# Patient Record
Sex: Female | Born: 1970 | Race: Black or African American | Hispanic: No | Marital: Married | Smoking: Never smoker
Health system: Southern US, Community
[De-identification: ages and names within clinical notes are randomized; demographics above are authoritative.]

## PROBLEM LIST (undated history)

## (undated) DIAGNOSIS — E119 Type 2 diabetes mellitus without complications: Secondary | ICD-10-CM

## (undated) DIAGNOSIS — I10 Essential (primary) hypertension: Secondary | ICD-10-CM

## (undated) DIAGNOSIS — E785 Hyperlipidemia, unspecified: Secondary | ICD-10-CM

## (undated) DIAGNOSIS — E559 Vitamin D deficiency, unspecified: Secondary | ICD-10-CM

## (undated) HISTORY — DX: Vitamin D deficiency, unspecified: E55.9

## (undated) HISTORY — PX: TUBAL LIGATION: SHX77

## (undated) HISTORY — DX: Hyperlipidemia, unspecified: E78.5

## (undated) HISTORY — DX: Essential (primary) hypertension: I10

---

## 2001-04-10 ENCOUNTER — Emergency Department (HOSPITAL_COMMUNITY): Admission: EM | Admit: 2001-04-10 | Discharge: 2001-04-10 | Payer: Self-pay

## 2007-04-21 ENCOUNTER — Emergency Department (HOSPITAL_COMMUNITY): Admission: EM | Admit: 2007-04-21 | Discharge: 2007-04-21 | Payer: Self-pay | Admitting: Emergency Medicine

## 2008-03-25 ENCOUNTER — Emergency Department (HOSPITAL_COMMUNITY): Admission: EM | Admit: 2008-03-25 | Discharge: 2008-03-25 | Payer: Self-pay | Admitting: Emergency Medicine

## 2008-07-22 ENCOUNTER — Emergency Department (HOSPITAL_COMMUNITY): Admission: EM | Admit: 2008-07-22 | Discharge: 2008-07-22 | Payer: Self-pay | Admitting: Emergency Medicine

## 2009-11-01 ENCOUNTER — Emergency Department (HOSPITAL_COMMUNITY): Admission: EM | Admit: 2009-11-01 | Discharge: 2009-11-02 | Payer: Self-pay | Admitting: Emergency Medicine

## 2010-07-13 ENCOUNTER — Encounter: Admission: RE | Admit: 2010-07-13 | Discharge: 2010-07-13 | Payer: Self-pay | Admitting: Internal Medicine

## 2010-11-23 LAB — POCT I-STAT, CHEM 8
Creatinine, Ser: 0.6 mg/dL (ref 0.4–1.2)
Glucose, Bld: 111 mg/dL — ABNORMAL HIGH (ref 70–99)
Hemoglobin: 12.9 g/dL (ref 12.0–15.0)
Potassium: 3.6 mEq/L (ref 3.5–5.1)

## 2010-11-23 LAB — DIFFERENTIAL
Eosinophils Absolute: 0.1 10*3/uL (ref 0.0–0.7)
Eosinophils Relative: 2 % (ref 0–5)
Lymphs Abs: 4.5 10*3/uL — ABNORMAL HIGH (ref 0.7–4.0)
Monocytes Relative: 8 % (ref 3–12)
Neutrophils Relative %: 39 % — ABNORMAL LOW (ref 43–77)

## 2010-11-23 LAB — CBC
HCT: 35.7 % — ABNORMAL LOW (ref 36.0–46.0)
MCV: 78 fL (ref 78.0–100.0)
RBC: 4.57 MIL/uL (ref 3.87–5.11)
WBC: 8.7 10*3/uL (ref 4.0–10.5)

## 2010-11-27 LAB — POCT CARDIAC MARKERS
CKMB, poc: <1 ng/mL — ABNORMAL LOW (ref 1.0–8.0)
Myoglobin, poc: 26.5 ng/mL (ref 12–200)
Troponin i, poc: <0.05 ng/mL (ref 0.00–0.09)

## 2010-11-27 LAB — POCT PREGNANCY, URINE: Preg Test, Ur: NEGATIVE

## 2011-06-02 LAB — URINALYSIS, ROUTINE W REFLEX MICROSCOPIC
Bilirubin Urine: NEGATIVE
Glucose, UA: 500 — AB
Hgb urine dipstick: NEGATIVE
Ketones, ur: NEGATIVE
Protein, ur: NEGATIVE

## 2011-06-02 LAB — URINE CULTURE: Culture: NO GROWTH

## 2011-06-02 LAB — POCT PREGNANCY, URINE
Operator id: 277751
Preg Test, Ur: NEGATIVE

## 2011-06-02 LAB — WET PREP, GENITAL: Yeast Wet Prep HPF POC: NONE SEEN

## 2011-06-06 LAB — GLUCOSE, CAPILLARY: Glucose-Capillary: 111 mg/dL — ABNORMAL HIGH (ref 70–99)

## 2011-06-16 LAB — URINALYSIS, ROUTINE W REFLEX MICROSCOPIC
Bilirubin Urine: NEGATIVE
Glucose, UA: 100 — AB
Nitrite: NEGATIVE
Protein, ur: 100 — AB
Specific Gravity, Urine: 1.033 — ABNORMAL HIGH
Urobilinogen, UA: 1
pH: 6

## 2011-06-16 LAB — PREGNANCY, URINE: Preg Test, Ur: NEGATIVE

## 2011-06-16 LAB — URINE MICROSCOPIC-ADD ON

## 2011-06-16 LAB — RAPID STREP SCREEN (MED CTR MEBANE ONLY): Streptococcus, Group A Screen (Direct): NEGATIVE

## 2012-12-21 ENCOUNTER — Emergency Department (INDEPENDENT_AMBULATORY_CARE_PROVIDER_SITE_OTHER): Payer: BC Managed Care – PPO

## 2012-12-21 ENCOUNTER — Encounter (HOSPITAL_COMMUNITY): Payer: Self-pay | Admitting: *Deleted

## 2012-12-21 ENCOUNTER — Emergency Department (INDEPENDENT_AMBULATORY_CARE_PROVIDER_SITE_OTHER)
Admission: EM | Admit: 2012-12-21 | Discharge: 2012-12-21 | Disposition: A | Discharge status: Home / Self Care | Payer: BC Managed Care – PPO | Source: Home / Self Care | Attending: Emergency Medicine | Admitting: Emergency Medicine

## 2012-12-21 DIAGNOSIS — T148XXA Other injury of unspecified body region, initial encounter: Secondary | ICD-10-CM

## 2012-12-21 DIAGNOSIS — S139XXA Sprain of joints and ligaments of unspecified parts of neck, initial encounter: Secondary | ICD-10-CM

## 2012-12-21 DIAGNOSIS — S161XXA Strain of muscle, fascia and tendon at neck level, initial encounter: Secondary | ICD-10-CM

## 2012-12-21 HISTORY — DX: Type 2 diabetes mellitus without complications: E11.9

## 2012-12-21 MED ORDER — HYDROCODONE-ACETAMINOPHEN 5-325 MG PO TABS: 1.0000 | ORAL_TABLET | ORAL | Status: DC | PRN

## 2012-12-21 MED ORDER — CYCLOBENZAPRINE HCL 10 MG PO TABS
10.0000 mg | ORAL_TABLET | Freq: Three times a day (TID) | ORAL | Status: DC | PRN
Start: 2012-12-21 — End: 2013-07-11
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
## 2012-12-26 NOTE — ED Provider Notes (Signed)
Medical screening examination/treatment/procedure(s) were performed by non-physician practitioner and as supervising physician I was immediately available for consultation/collaboration.  Leslee Home, M.D.  Reuben Likes, MD 12/26/12 [phone]

## 2013-02-17 ENCOUNTER — Emergency Department (INDEPENDENT_AMBULATORY_CARE_PROVIDER_SITE_OTHER)
Admission: EM | Admit: 2013-02-17 | Discharge: 2013-02-17 | Disposition: A | Discharge status: Home / Self Care | Payer: BC Managed Care – PPO | Source: Home / Self Care

## 2013-02-17 ENCOUNTER — Encounter (HOSPITAL_COMMUNITY): Payer: Self-pay | Admitting: Emergency Medicine

## 2013-02-17 DIAGNOSIS — E119 Type 2 diabetes mellitus without complications: Secondary | ICD-10-CM

## 2013-02-17 DIAGNOSIS — S39012A Strain of muscle, fascia and tendon of lower back, initial encounter: Secondary | ICD-10-CM

## 2013-02-17 DIAGNOSIS — R81 Glycosuria: Secondary | ICD-10-CM

## 2013-02-17 DIAGNOSIS — M791 Myalgia, unspecified site: Secondary | ICD-10-CM

## 2013-02-17 DIAGNOSIS — S335XXA Sprain of ligaments of lumbar spine, initial encounter: Secondary | ICD-10-CM

## 2013-02-17 DIAGNOSIS — IMO0001 Reserved for inherently not codable concepts without codable children: Secondary | ICD-10-CM

## 2013-02-17 LAB — POCT URINALYSIS DIP (DEVICE)
Bilirubin Urine: NEGATIVE
Hgb urine dipstick: NEGATIVE
Nitrite: NEGATIVE
Specific Gravity, Urine: 1.02 (ref 1.005–1.030)
Urobilinogen, UA: 0.2 mg/dL (ref 0.0–1.0)
pH: 5.5 (ref 5.0–8.0)

## 2013-02-17 MED ORDER — DICLOFENAC POTASSIUM 50 MG PO TABS: 50.0000 mg | ORAL_TABLET | Freq: Two times a day (BID) | ORAL | Status: DC

## 2013-02-17 MED ORDER — TRAMADOL HCL 50 MG PO TABS: 50.0000 mg | ORAL_TABLET | Freq: Four times a day (QID) | ORAL | Status: DC | PRN

## 2013-02-17 NOTE — ED Provider Notes (Signed)
History     CSN: [id number]  Arrival date & time 02/17/13  1021   None     Chief Complaint  Patient presents with  . Back Pain    (Consider location/radiation/quality/duration/timing/severity/associated sxs/prior treatment) HPI Comments: 42 year old female stay she developed pain in the lower left back. She describes it as a sharp pain in the left paralumbar musculature that radiates to the upper left buttock. It is worse with movement and better with certain positions using a pillow. This morning the pain was worse. No other radiation other than as described above. She denies any known event, injury or trauma. She said she had been doing some vacuuming and other house for that would place him force on her back.   Past Medical History  Diagnosis Date  . Diabetes mellitus without complication     Past Surgical History  Procedure Laterality Date  . Tubal ligation Bilateral     No family history on file.  History  Substance Use Topics  . Smoking status: Never Smoker   . Smokeless tobacco: Not on file  . Alcohol Use: No    OB History   Grav Para Term Preterm Abortions TAB SAB Ect Mult Living                  Review of Systems  Constitutional: Negative for fever, chills and activity change.  HENT: Negative.   Respiratory: Negative.   Cardiovascular: Negative.   Musculoskeletal:       As per HPI  Skin: Negative for color change, pallor and rash.  Neurological: Negative.     Allergies  Review of patient's allergies indicates no known allergies.  Home Medications   Current Outpatient Rx  Name  Route  Sig  Dispense  Refill  . cyclobenzaprine (FLEXERIL) 10 MG tablet   Oral   Take 1 tablet (10 mg total) by mouth 3 (three) times daily as needed for muscle spasms (and muscle pain).   20 tablet   0   . diclofenac (CATAFLAM) 50 MG tablet   Oral   Take 1 tablet (50 mg total) by mouth 2 (two) times daily. Take with food   20 tablet   0   .  HYDROcodone-acetaminophen (NORCO/VICODIN) 5-325 MG per tablet   Oral   Take 1 tablet by mouth every 4 (four) hours as needed for pain.   10 tablet   0   . traMADol (ULTRAM) 50 MG tablet   Oral   Take 1 tablet (50 mg total) by mouth every 6 (six) hours as needed for pain.   15 tablet   0     BP 126/88  Pulse 85  Temp(Src) 97.1 F (36.2 C) (Oral)  Resp 16  SpO2 95%  LMP 01/21/2013  Physical Exam  Nursing note and vitals reviewed. Constitutional: She is oriented to person, place, and time. She appears well-developed and well-nourished. No distress.  HENT:  Head: Normocephalic and atraumatic.  Eyes: EOM are normal. Pupils are equal, round, and reactive to light.  Neck: Normal range of motion. Neck supple.  Musculoskeletal:  Tenderness in the left paralumbar musculature. Tenderness in the left parasacral musculature. Tenderness over the upper most left buttock musculature. No radiation to the thigh or knee. No radicular pain. Extension and flexion of the knee is normal with strength 4/5 symmetric. She is able to bear weight and ambula without difficulty.  Lymphadenopathy:    She has no cervical adenopathy.  Neurological: She is alert and oriented to person,  place, and time. No cranial nerve deficit.  Skin: Skin is warm and dry.  Psychiatric: She has a normal mood and affect.    ED Course  Procedures (including critical care time)  Labs Reviewed  POCT URINALYSIS DIP (DEVICE) - Abnormal; Notable for the following:    Glucose, UA >=1000 (*)    All other components within normal limits   No results found.   1. Lumbar strain, initial encounter   2. Myalgia       MDM  Limit Activity such as lifting, bending, stooping, pulling, twisting them exacerbate the pain. Recommend applying heat such as determined care wraps during the day. Stretches is demonstrated. Tramadol 50 mg every 4 hours when necessary pain Cataflam 50 mg 2 times a day as needed take with food. Any new  symptoms problems or worsening may return or followup with your PCP. Check her blood sugars at home and adjust her medication for your elevated blood sugar.        Hayden Rasmussen, NP 02/17/13 1203  Hayden Rasmussen, NP 02/17/13 1205  Hayden Rasmussen, NP 02/17/13 2039
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
## 2013-02-17 NOTE — ED Provider Notes (Signed)
Medical screening examination/treatment/procedure(s) were performed by non-physician practitioner and as supervising physician I was immediately available for consultation/collaboration.  Leslee Home, M.D.  Reuben Likes, MD 02/17/13 [phone]

## 2013-03-13 ENCOUNTER — Encounter (HOSPITAL_COMMUNITY): Payer: Self-pay | Admitting: Emergency Medicine

## 2013-03-13 ENCOUNTER — Emergency Department (INDEPENDENT_AMBULATORY_CARE_PROVIDER_SITE_OTHER)
Admission: EM | Admit: 2013-03-13 | Discharge: 2013-03-13 | Disposition: A | Discharge status: Home / Self Care | Payer: BC Managed Care – PPO | Source: Home / Self Care

## 2013-03-13 DIAGNOSIS — S40029A Contusion of unspecified upper arm, initial encounter: Secondary | ICD-10-CM

## 2013-03-13 DIAGNOSIS — S20219A Contusion of unspecified front wall of thorax, initial encounter: Secondary | ICD-10-CM

## 2013-03-13 DIAGNOSIS — S301XXA Contusion of abdominal wall, initial encounter: Secondary | ICD-10-CM

## 2013-03-13 DIAGNOSIS — S40021A Contusion of right upper arm, initial encounter: Secondary | ICD-10-CM
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
## 2013-07-06 ENCOUNTER — Encounter: Payer: Self-pay | Admitting: Internal Medicine

## 2013-07-06 DIAGNOSIS — E119 Type 2 diabetes mellitus without complications: Secondary | ICD-10-CM

## 2013-07-06 DIAGNOSIS — E785 Hyperlipidemia, unspecified: Secondary | ICD-10-CM

## 2013-07-06 DIAGNOSIS — I10 Essential (primary) hypertension: Secondary | ICD-10-CM | POA: Insufficient documentation

## 2013-07-11 ENCOUNTER — Ambulatory Visit: Payer: BC Managed Care – HMO | Admitting: Emergency Medicine

## 2013-07-11 ENCOUNTER — Encounter: Payer: Self-pay | Admitting: Emergency Medicine

## 2013-07-11 VITALS — BP 118/62 | HR 64 | Temp 98.2°F | Resp 16 | Ht 62.0 in | Wt 161.0 lb

## 2013-07-11 DIAGNOSIS — E119 Type 2 diabetes mellitus without complications: Secondary | ICD-10-CM

## 2013-07-11 DIAGNOSIS — D649 Anemia, unspecified: Secondary | ICD-10-CM

## 2013-07-11 DIAGNOSIS — E559 Vitamin D deficiency, unspecified: Secondary | ICD-10-CM

## 2013-07-11 LAB — CBC WITH DIFFERENTIAL/PLATELET
Hemoglobin: 12 g/dL (ref 12.0–15.0)
Lymphs Abs: 1.7 10*3/uL (ref 0.7–4.0)
Monocytes Relative: 7 % (ref 3–12)
Neutro Abs: 3.1 10*3/uL (ref 1.7–7.7)
Neutrophils Relative %: 59 % (ref 43–77)
RBC: 4.76 MIL/uL (ref 3.87–5.11)

## 2013-07-11 LAB — BASIC METABOLIC PANEL WITH GFR
CO2: 27 mEq/L (ref 19–32)
GFR, Est African American: >89 mL/min
Glucose, Bld: 207 mg/dL — ABNORMAL HIGH (ref 70–99)
Potassium: 4.1 mEq/L (ref 3.5–5.3)
Sodium: 138 mEq/L (ref 135–145)

## 2013-07-11 LAB — IRON AND TIBC: %SAT: 22 % (ref 20–55)

## 2013-07-11 LAB — VITAMIN D 25 HYDROXY (VIT D DEFICIENCY, FRACTURES): Vit D, 25-Hydroxy: 34 ng/mL (ref 30–89)

## 2013-07-11 MED ORDER — FREESTYLE SYSTEM KIT: 1.0000 | PACK | Status: AC | PRN

## 2013-07-11 NOTE — Patient Instructions (Signed)
Vitamin D Deficiency  Not having enough vitamin D is called a deficiency. Your body needs this vitamin to keep your bones strong and healthy. Having too little of it can make your bones soft or can cause other health problems.  HOME CARE  Take all vitamins, herbs, or nutrition drinks (supplements) as told by your doctor.  Have your blood tested 2 months after taking vitamins, herbs, or nutrition drinks.  Eat foods that have vitamin D. This includes:  Dairy products, cereals, or juices with added vitamin D. Check the label.  Fatty fish like salmon or trout.  Eggs.  Oysters.  Do not use tanning beds.  Stay at a healthy weight. Lose weight if needed.  Keep all doctor visits as told. GET HELP IF:  You have questions.  You continue to have problems.  You feel sick to your stomach (nauseous) or throw up (vomit).  You cannot go poop (constipated).  You feel confused.  You have severe belly (abdominal) or back pain. MAKE SURE YOU:  Understand these instructions.  Will watch your condition.  Will get help right away if you are not doing well or get worse. Document Released: 08/10/2011 Document Revised: 12/16/2012 Document Reviewed: 08/10/2011 [facility] Patient Information 2014 [place], [place]. Iron Deficiency Anemia  Anemia is when you have a low number of healthy red blood cells. HOME CARE   Ask your doctor or dietician what foods you should eat.  Take iron and vitamins as told by your doctor.  Eat foods that have iron in them. This includes liver, lean beef, whole-grain bread, eggs, dried fruit, and dark green leafy vegetables. GET HELP RIGHT AWAY IF:  You pass out (faint).  You have chest pain.  You feel sick to your stomach (nauseous) or throw up (vomit).  You get very short of breath with activity.  You are weak.  You are thirstier than normal.  You have a fast heartbeat.  You start to sweat or become lightheaded when getting up from a chair or  bed. MAKE SURE YOU:  Understand these instructions.  Will watch your condition.  Will get help right away if you are not doing well or get worse. Document Released: 09/23/2010 Document Revised: 11/13/2011 Document Reviewed: 09/23/2010 [facility] Patient Information 2014 [place], [place]. Diabetes Meal Planning Guide The diabetes meal planning guide is a tool to help you plan your meals and snacks. It is important for people with diabetes to manage their blood glucose (sugar) levels. Choosing the right foods and the right amounts throughout your day will help control your blood glucose. Eating right can even help you improve your blood pressure and reach or maintain a healthy weight. CARBOHYDRATE COUNTING MADE EASY When you eat carbohydrates, they turn to sugar. This raises your blood glucose level. Counting carbohydrates can help you control this level so you feel better. When you plan your meals by counting carbohydrates, you can have more flexibility in what you eat and balance your medicine with your food intake. Carbohydrate counting simply means adding up the total amount of carbohydrate grams in your meals and snacks. Try to eat about the same amount at each meal. Foods with carbohydrates are listed below. Each portion below is 1 carbohydrate serving or 15 grams of carbohydrates. Ask your dietician how many grams of carbohydrates you should eat at each meal or snack. Grains and Starches  1 slice bread.   English muffin or hotdog/hamburger bun.   cup cold cereal (unsweetened).   cup cooked pasta or rice.  cup starchy vegetables (corn, potatoes, peas, beans, winter squash).  1 tortilla (6 inches).   bagel.  1 waffle or pancake (size of a CD).   cup cooked cereal.  4 to 6 small crackers. *Whole grain is recommended. Fruit  1 cup fresh unsweetened berries, melon, papaya, pineapple.  1 small fresh fruit.   banana or mango.   cup fruit juice (4 oz unsweetened).    cup canned fruit in natural juice or water.  2 tbs dried fruit.  12 to 15 grapes or cherries. Milk and Yogurt  1 cup fat-free or 1% milk.  1 cup soy milk.  6 oz light yogurt with sugar-free sweetener.  6 oz low-fat soy yogurt.  6 oz plain yogurt. Vegetables  1 cup raw or  cup cooked is counted as 0 carbohydrates or a "free" food.  If you eat 3 or more servings at 1 meal, count them as 1 carbohydrate serving. Other Carbohydrates   oz chips or pretzels.   cup ice cream or frozen yogurt.   cup sherbet or sorbet.  2 inch square cake, no frosting.  1 tbs honey, sugar, jam, jelly, or syrup.  2 small cookies.  3 squares of graham crackers.  3 cups popcorn.  6 crackers.  1 cup broth-based soup.  Count 1 cup casserole or other mixed foods as 2 carbohydrate servings.  Foods with less than 20 calories in a serving may be counted as 0 carbohydrates or a "free" food. You may want to purchase a book or computer software that lists the carbohydrate gram counts of different foods. In addition, the nutrition facts panel on the labels of the foods you eat are a good source of this information. The label will tell you how big the serving size is and the total number of carbohydrate grams you will be eating per serving. Divide this number by 15 to obtain the number of carbohydrate servings in a portion. Remember, 1 carbohydrate serving equals 15 grams of carbohydrate. SERVING SIZES Measuring foods and serving sizes helps you make sure you are getting the right amount of food. The list below tells how big or small some common serving sizes are.  1 oz.........4 stacked dice.  3 oz........Marland KitchenDeck of cards.  1 tsp.......Marland KitchenTip of little finger.  1 tbs......Marland KitchenMarland KitchenThumb.  2 tbs.......Marland KitchenGolf ball.   cup......Marland KitchenHalf of a fist.  1 cup.......Marland KitchenA fist. SAMPLE DIABETES MEAL PLAN Below is a sample meal plan that includes foods from the grain and starches, dairy, vegetable, fruit, and meat  groups. A dietician can individualize a meal plan to fit your calorie needs and tell you the number of servings needed from each food group. However, controlling the total amount of carbohydrates in your meal or snack is more important than making sure you include all of the food groups at every meal. You may interchange carbohydrate containing foods (dairy, starches, and fruits). The meal plan below is an example of a 2000 calorie diet using carbohydrate counting. This meal plan has 17 carbohydrate servings. Breakfast  1 cup oatmeal (2 carb servings).   cup light yogurt (1 carb serving).  1 cup blueberries (1 carb serving).   cup almonds. Snack  1 large apple (2 carb servings).  1 low-fat string cheese stick. Lunch  Chicken breast salad.  1 cup spinach.   cup chopped tomatoes.  2 oz chicken breast, sliced.  2 tbs low-fat [place] dressing.  12 whole-wheat crackers (2 carb servings).  12 to 15 grapes (1 carb serving).  1  cup low-fat milk (1 carb serving). Snack  1 cup carrots.   cup hummus (1 carb serving). Dinner  3 oz broiled salmon.  1 cup brown rice (3 carb servings). Snack  1  cups steamed broccoli (1 carb serving) drizzled with 1 tsp olive oil and lemon juice.  1 cup light pudding (2 carb servings). DIABETES MEAL PLANNING WORKSHEET Your dietician can use this worksheet to help you decide how many servings of foods and what types of foods are right for you.  BREAKFAST Food Group and Servings / Carb Servings Grain/Starches __________________________________ Dairy __________________________________________ Vegetable ______________________________________ Fruit ___________________________________________ Meat __________________________________________ Fat ____________________________________________ LUNCH Food Group and Servings / Carb Servings Grain/Starches ___________________________________ Dairy ___________________________________________ Fruit  ____________________________________________ Meat ___________________________________________ Fat _____________________________________________ Laural Golden Food Group and Servings / Carb Servings Grain/Starches ___________________________________ Dairy ___________________________________________ Fruit ____________________________________________ Meat ___________________________________________ Fat _____________________________________________ SNACKS Food Group and Servings / Carb Servings Grain/Starches ___________________________________ Dairy ___________________________________________ Vegetable _______________________________________ Fruit ____________________________________________ Meat ___________________________________________ Fat _____________________________________________ DAILY TOTALS Starches _________________________ Vegetable ________________________ Fruit ____________________________ Dairy ____________________________ Meat ____________________________ Fat ______________________________ Document Released: 05/18/2005 Document Revised: 11/13/2011 Document Reviewed: 03/29/2009 [employer] Patient Information 2014 [place], [place].

## 2013-07-14 NOTE — Progress Notes (Signed)
Spoke with Pt about lab results.  Informed her of MF change and advised to check BS as directed by Loree Fee, PA-C and call if BS runs about 140 for Rx change. Appt sched for pt to F/U in Mid-January.

## 2013-07-14 NOTE — Progress Notes (Signed)
  Subjective:    Patient ID: Joanna Marsh, female    DOB: Mar 27, 1971, 42 y.o.   MRN: [id number]  HPI Comments: 42 yo female presents for early F/U with start of Nesina and D/C of Januvia. She has been trying to decrease portion size but is not eating any differently. She does not tolerate Iron, so added Ensure to help with vitamins but does not think it is the Diabetic brand. She added 1 Vit D but is unsure of dosage. Overall feels well, no SE with RX change. She denies checking BS regularly.  Hypertension  Hyperlipidemia  Diabetes    Current Outpatient Prescriptions on File Prior to Visit  Medication Sig Dispense Refill  . Alogliptin Benzoate (NESINA) 25 MG TABS Take 25 mg by mouth daily.      Marland Kitchen atorvastatin (LIPITOR) 40 MG tablet Take 40 mg by mouth daily. TAKES 1/2      . lisinopril (PRINIVIL,ZESTRIL) 10 MG tablet Take 10 mg by mouth daily.      Marland Kitchen METFORMIN HCL PO Take 500 mg by mouth 2 (two) times daily.        No current facility-administered medications on file prior to visit.   Review of patient's allergies indicates no known allergies.  Past Medical History  Diagnosis Date  . Diabetes mellitus without complication   . Hypertension   . Hyperlipidemia   . Unspecified vitamin D deficiency      Review of Systems  All other systems reviewed and are negative.      BP 118/62  Pulse 64  Temp(Src) 98.2 F (36.8 C) (Temporal)  Resp 16  Ht 5\' 2"  (1.575 m)  Wt 161 lb (73.029 kg)  BMI 29.44 kg/m2  LMP 07/01/2013  Objective:   Physical Exam  Nursing note and vitals reviewed. Constitutional: She appears well-developed and well-nourished.  HENT:  Head: Normocephalic.  Eyes: Pupils are equal, round, and reactive to light.  Neck: Normal range of motion.  Cardiovascular: Normal rate, regular rhythm, normal heart sounds and intact distal pulses.   Pulmonary/Chest: Effort normal and breath sounds normal.  Abdominal: Soft. Bowel sounds are normal.  Musculoskeletal:  Normal range of motion.  Neurological: She is alert.  Skin: Skin is warm and dry.  Psychiatric: She has a normal mood and affect. Judgment normal.          Assessment & Plan:  F/U DM, D-def, Iron Def, advised needs to improve diet, change Ensure to Diabetic brand, check labs, and continue current plan AD.

## 2013-09-16 ENCOUNTER — Ambulatory Visit: Payer: Self-pay | Admitting: Emergency Medicine

## 2013-11-19 ENCOUNTER — Encounter: Payer: Self-pay | Admitting: Emergency Medicine

## 2013-11-19 ENCOUNTER — Ambulatory Visit (INDEPENDENT_AMBULATORY_CARE_PROVIDER_SITE_OTHER): Payer: BC Managed Care – HMO | Admitting: Emergency Medicine

## 2013-11-19 VITALS — BP 122/80 | HR 64 | Temp 98.4°F | Resp 18 | Ht 62.0 in | Wt 160.0 lb

## 2013-11-19 DIAGNOSIS — D649 Anemia, unspecified: Secondary | ICD-10-CM

## 2013-11-19 DIAGNOSIS — E782 Mixed hyperlipidemia: Secondary | ICD-10-CM

## 2013-11-19 DIAGNOSIS — R079 Chest pain, unspecified: Secondary | ICD-10-CM

## 2013-11-19 DIAGNOSIS — E1159 Type 2 diabetes mellitus with other circulatory complications: Secondary | ICD-10-CM

## 2013-11-19 DIAGNOSIS — I10 Essential (primary) hypertension: Secondary | ICD-10-CM

## 2013-11-19 LAB — CBC WITH DIFFERENTIAL/PLATELET
Basophils Absolute: 0 10*3/uL (ref 0.0–0.1)
Basophils Relative: 0 % (ref 0–1)
Eosinophils Absolute: 0.1 10*3/uL (ref 0.0–0.7)
Eosinophils Relative: 2 % (ref 0–5)
HEMATOCRIT: 34.7 % — AB (ref 36.0–46.0)
HEMOGLOBIN: 11.5 g/dL — AB (ref 12.0–15.0)
LYMPHS ABS: 2.3 10*3/uL (ref 0.7–4.0)
Lymphocytes Relative: 42 % (ref 12–46)
MCH: 24.6 pg — ABNORMAL LOW (ref 26.0–34.0)
MCHC: 33.1 g/dL (ref 30.0–36.0)
MCV: 74.3 fL — ABNORMAL LOW (ref 78.0–100.0)
MONOS PCT: 7 % (ref 3–12)
Monocytes Absolute: 0.4 10*3/uL (ref 0.1–1.0)
NEUTROS ABS: 2.7 10*3/uL (ref 1.7–7.7)
NEUTROS PCT: 49 % (ref 43–77)
Platelets: 330 10*3/uL (ref 150–400)
RBC: 4.67 MIL/uL (ref 3.87–5.11)
RDW: 14.3 % (ref 11.5–15.5)
WBC: 5.5 10*3/uL (ref 4.0–10.5)

## 2013-11-19 LAB — HEPATIC FUNCTION PANEL
ALBUMIN: 4.3 g/dL (ref 3.5–5.2)
ALK PHOS: 38 U/L — AB (ref 39–117)
ALT: 20 U/L (ref 0–35)
AST: 23 U/L (ref 0–37)
BILIRUBIN TOTAL: 0.4 mg/dL (ref 0.2–1.2)
Bilirubin, Direct: 0.1 mg/dL (ref 0.0–0.3)
Indirect Bilirubin: 0.3 mg/dL (ref 0.2–1.2)
TOTAL PROTEIN: 6.9 g/dL (ref 6.0–8.3)

## 2013-11-19 LAB — LIPID PANEL
CHOLESTEROL: 111 mg/dL (ref 0–200)
HDL: 47 mg/dL (ref >39)
LDL Cholesterol: 54 mg/dL (ref 0–99)
Total CHOL/HDL Ratio: 2.4 Ratio
Triglycerides: 52 mg/dL (ref <150)
VLDL: 10 mg/dL (ref 0–40)

## 2013-11-19 LAB — BASIC METABOLIC PANEL WITH GFR
BUN: 6 mg/dL (ref 6–23)
CALCIUM: 9.3 mg/dL (ref 8.4–10.5)
CO2: 30 meq/L (ref 19–32)
Chloride: 102 mEq/L (ref 96–112)
Creat: 0.66 mg/dL (ref 0.50–1.10)
GFR, Est African American: >89 mL/min
GFR, Est Non African American: >89 mL/min
GLUCOSE: 132 mg/dL — AB (ref 70–99)
POTASSIUM: 4 meq/L (ref 3.5–5.3)
SODIUM: 139 meq/L (ref 135–145)

## 2013-11-19 LAB — IRON AND TIBC
%SAT: 11 % — AB (ref 20–55)
IRON: 40 ug/dL — AB (ref 42–145)
TIBC: 356 ug/dL (ref 250–470)
UIBC: 316 ug/dL (ref 125–400)

## 2013-11-19 LAB — HEMOGLOBIN A1C
HEMOGLOBIN A1C: 7.4 % — AB (ref <5.7)
MEAN PLASMA GLUCOSE: 166 mg/dL — AB (ref <117)

## 2013-11-19 LAB — VITAMIN B12: VITAMIN B 12: 278 pg/mL (ref 211–911)

## 2013-11-19 NOTE — Patient Instructions (Addendum)
VIT D NEED 5000 UNITS DAILY*  We want weight loss that will last so you should lose 1-2 pounds a week.  THAT IS IT! Please pick THREE things a month to change. Once it is a habit check off the item. Then pick another three items off the list to become habits.  If you are already doing a habit on the list GREAT!  Cross that item off! o Don't drink your calories. Ie, alcohol, soda, fruit juice, and sweet tea.  o Drink more water. Drink a glass when you feel hungry or before each meal.  o Eat breakfast - Complex carb and protein (likeDannon light and fit yogurt, oatmeal, fruit, eggs, [place] bacon). o Measure your cereal.  Eat no more than one cup a day. (ie [place]) o Eat an apple a day. o Add a vegetable a day. o Try a new vegetable a month. o Use Pam! Stop using oil or butter to cook. o Don't finish your plate or use smaller plates. o Share your dessert. o Eat sugar free Jello for dessert or frozen grapes. o Don't eat 2-3 hours before bed. o Switch to whole wheat bread, pasta, and brown rice. o Make healthier choices when you eat out. No fries! o Pick baked chicken, NOT fried. o Don't forget to SLOW DOWN when you eat. It is not going anywhere.  o Take the stairs. o Park far away in the parking lot o [employer] (or weights) for 10 minutes while watching TV. o Walk at work for 10 minutes during break. o Walk outside 1 time a week with your friend, kids, dog, or significant other. o Start a walking group at church. o Walk the mall as much as you can tolerate.  o Keep a food diary. o Weigh yourself daily. o Walk for 15 minutes 3 days per week. o Cook at home more often and eat out less.  If life happens and you go back to old habits, it is okay.  Just start over. You can do it!   If you experience chest pain, get short of breath, or tired during the exercise, please stop immediately and inform your doctor.    Bad carbs also include fruit juice, alcohol, and sweet tea. These are empty  calories that do not signal to your brain that you are full.   Please remember the good carbs are still carbs which convert into sugar. So please measure them out no more than 1/2-1 cup of rice, oatmeal, pasta, and beans.  Veggies are however free foods! Pile them on.   I like lean protein at every meal such as chicken, [place], pork chops, cottage cheese, etc. Just do not fry these meats and please center your meal around vegetable, the meats should be a side dish.   No all fruit is created equal. Please see the list below, the fruit at the bottom is higher in sugars than the fruit at the top   Diabetes and Exercise Exercising regularly is important. It is not just about losing weight. It has many health benefits, such as:  Improving your overall fitness, flexibility, and endurance.  Increasing your bone density.  Helping with weight control.  Decreasing your body fat.  Increasing your muscle strength.  Reducing stress and tension.  Improving your overall health. People with diabetes who exercise gain additional benefits because exercise:  Reduces appetite.  Improves the body's use of blood sugar (glucose).  Helps lower or control blood glucose.  Decreases blood  pressure.  Helps control blood lipids (such as cholesterol and triglycerides).  Improves the body's use of the hormone insulin by:  Increasing the body's insulin sensitivity.  Reducing the body's insulin needs.  Decreases the risk for heart disease because exercising:  Lowers cholesterol and triglycerides levels.  Increases the levels of good cholesterol (such as high-density lipoproteins [HDL]) in the body.  Lowers blood glucose levels. YOUR ACTIVITY PLAN  Choose an activity that you enjoy and set realistic goals. Your health care provider or diabetes educator can help you make an activity plan that works for you. You can break activities into 2 or 3 sessions throughout the day. Doing so is as good as one  long session. Exercise ideas include:  Taking the dog for a walk.  Taking the stairs instead of the elevator.  Dancing to your favorite song.  Doing your favorite exercise with a friend. RECOMMENDATIONS FOR EXERCISING WITH TYPE 1 OR TYPE 2 DIABETES   Check your blood glucose before exercising. If blood glucose levels are greater than 240 mg/dL, check for urine ketones. Do not exercise if ketones are present.  Avoid injecting insulin into areas of the body that are going to be exercised. For example, avoid injecting insulin into:  The arms when playing tennis.  The legs when jogging.  Keep a record of:  Food intake before and after you exercise.  Expected peak times of insulin action.  Blood glucose levels before and after you exercise.  The type and amount of exercise you have done.  Review your records with your health care provider. Your health care provider will help you to develop guidelines for adjusting food intake and insulin amounts before and after exercising.  If you take insulin or oral hypoglycemic agents, watch for signs and symptoms of hypoglycemia. They include:  Dizziness.  Shaking.  Sweating.  Chills.  Confusion.  Drink plenty of water while you exercise to prevent dehydration or heat stroke. Body water is lost during exercise and must be replaced.  Talk to your health care provider before starting an exercise program to make sure it is safe for you. Remember, almost any type of activity is better than none. Document Released: 11/11/2003 Document Revised: 04/23/2013 Document Reviewed: 01/28/2013 [facility] Patient Information 2014 [place], [place]. Diabetes and Foot Care Diabetes may cause you to have problems because of poor blood supply (circulation) to your feet and legs. This may cause the skin on your feet to become thinner, break easier, and heal more slowly. Your skin may become dry, and the skin may peel and crack. You may also have nerve damage  in your legs and feet causing decreased feeling in them. You may not notice minor injuries to your feet that could lead to infections or more serious problems. Taking care of your feet is one of the most important things you can do for yourself.  HOME CARE INSTRUCTIONS  Wear shoes at all times, even in the house. Do not go barefoot. Bare feet are easily injured.  Check your feet daily for blisters, cuts, and redness. If you cannot see the bottom of your feet, use a mirror or ask someone for help.  Wash your feet with warm water (do not use hot water) and mild soap. Then pat your feet and the areas between your toes until they are completely dry. Do not soak your feet as this can dry your skin.  Apply a moisturizing lotion or petroleum jelly (that does not contain alcohol and is unscented)  to the skin on your feet and to dry, brittle toenails. Do not apply lotion between your toes.  Trim your toenails straight across. Do not dig under them or around the cuticle. File the edges of your nails with an emery board or nail file.  Do not cut corns or calluses or try to remove them with medicine.  Wear clean socks or stockings every day. Make sure they are not too tight. Do not wear knee-high stockings since they may decrease blood flow to your legs.  Wear shoes that fit properly and have enough cushioning. To break in new shoes, wear them for just a few hours a day. This prevents you from injuring your feet. Always look in your shoes before you put them on to be sure there are no objects inside.  Do not cross your legs. This may decrease the blood flow to your feet.  If you find a minor scrape, cut, or break in the skin on your feet, keep it and the skin around it clean and dry. These areas may be cleansed with mild soap and water. Do not cleanse the area with peroxide, alcohol, or iodine.  When you remove an adhesive bandage, be sure not to damage the skin around it.  If you have a wound, look at  it several times a day to make sure it is healing.  Do not use heating pads or hot water bottles. They may burn your skin. If you have lost feeling in your feet or legs, you may not know it is happening until it is too late.  Make sure your health care provider performs a complete foot exam at least annually or more often if you have foot problems. Report any cuts, sores, or bruises to your health care provider immediately. SEEK MEDICAL CARE IF:   You have an injury that is not healing.  You have cuts or breaks in the skin.  You have an ingrown nail.  You notice redness on your legs or feet.  You feel burning or tingling in your legs or feet.  You have pain or cramps in your legs and feet.  Your legs or feet are numb.  Your feet always feel cold. SEEK IMMEDIATE MEDICAL CARE IF:   There is increasing redness, swelling, or pain in or around a wound.  There is a red line that goes up your leg.  Pus is coming from a wound.  You develop a fever or as directed by your health care provider.  You notice a bad smell coming from an ulcer or wound. Document Released: 08/18/2000 Document Revised: 04/23/2013 Document Reviewed: 01/28/2013 [facility] Patient Information 2014 [place].  IF ANY SYMPTOMS INCREASE ER Chest Pain (Nonspecific) Chest pain has many causes. Your pain could be caused by something serious, such as a heart attack or a blood clot in the lungs. It could also be caused by something less serious, such as a chest bruise or a virus. Follow up with your doctor. More lab tests or other studies may be needed to find the cause of your pain. Most of the time, nonspecific chest pain will improve within 2 to 3 days of rest and mild pain medicine. HOME CARE  For chest bruises, you may put ice on the sore area for 15-20 minutes, 03-04 times a day. Do this only if it makes you feel better.  Put ice in a plastic bag.  Place a towel between the skin and the bag.  Rest for the  next 2 to 3 days.  Go back to work if the pain improves.  See your doctor if the pain lasts longer than 1 to 2 weeks.  Only take medicine as told by your doctor.  Quit smoking if you smoke. GET HELP RIGHT AWAY IF:   There is more pain or pain that spreads to the arm, neck, jaw, back, or belly (abdomen).  You have shortness of breath.  You cough more than usual or cough up blood.  You have very bad back or belly pain, feel sick to your stomach (nauseous), or throw up (vomit).  You have very bad weakness.  You pass out (faint).  You have a fever. Any of these problems may be serious and may be an emergency. Do not wait to see if the problems will go away. Get medical help right away. Call your local emergency services 911 in [place] Do not drive yourself to the hospital. MAKE SURE YOU:   Understand these instructions.  Will watch this condition.  Will get help right away if you or your child is not doing well or gets worse. Document Released: 02/07/2008 Document Revised: 11/13/2011 Document Reviewed: 02/07/2008 [facility] Patient Information 2014 [place], [place].

## 2013-11-19 NOTE — Progress Notes (Signed)
Subjective:    Patient ID: Joanna Marsh, female    DOB: 1971-02-04, 43 y.o.   MRN: [id number]  HPI Comments: 43 yo AAF presents for 3 month F/U for HTN, Cholesterol, DM, D. Deficient. She did not f/u AD in January for recheck of labs.  She has not been checking BS or BP. She has started nessina and increased MF to TID. She is eating healthy but occasionally skips meals. He checks feet routinely and denies skin break down or neuropathy increase. She does have occasional hand/ feet numbness. She has not been exercising.  SHE HAS HAD CHEST DISCOMFORT/ SOB on/ off x several weeks. She denies any triggers. She thinks the pain is coming from her neck because it often feels tight. She denies any neck injury/ strain. She notes pain is in midchest denies sharp/ stabbing but notes pressure. She notes can last several minutes and resolves on it's own.  LAST LABS T 109 tg 78 H 41 L 52 A1C 10.1 INSULIN 28 D 38    Medication List       This list is accurate as of: 11/19/13 11:23 AM.  Always use your most recent med list.               atorvastatin 40 MG tablet  Commonly known as:  LIPITOR  Take 40 mg by mouth daily. TAKES 1/2     cholecalciferol 1000 UNITS tablet  Commonly known as:  VITAMIN D  Take 1,000 Units by mouth daily.     glucose monitoring kit monitoring kit  1 each by Does not apply route as needed for other. Please fill strips 1 qd     lisinopril 10 MG tablet  Commonly known as:  PRINIVIL,ZESTRIL  Take 10 mg by mouth daily.     METFORMIN HCL PO  Take 500 mg by mouth 3 (three) times daily.     mometasone 0.1 % cream  Commonly known as:  ELOCON  Apply 1 application topically daily.     NESINA 25 MG Tabs  Generic drug:  Alogliptin Benzoate  Take 25 mg by mouth daily.         No Known Allergies  Past Medical History  Diagnosis Date  . Diabetes mellitus without complication   . Hypertension   . Hyperlipidemia   . Unspecified vitamin D deficiency      Review  of Systems  Constitutional: Positive for fatigue.  Respiratory: Positive for shortness of breath.   Cardiovascular: Positive for chest pain.  All other systems reviewed and are negative.   BP 122/80  Pulse 64  Temp(Src) 98.4 F (36.9 C) (Temporal)  Resp 18  Ht _0  (1.575 m)  Wt 160 lb (72.576 kg)  BMI 29.26 kg/m2  LMP 11/14/2013     Objective:   Physical Exam  Nursing note and vitals reviewed. Constitutional: She is oriented to person, place, and time. She appears well-developed and well-nourished. No distress.  HENT:  Head: Normocephalic and atraumatic.  Right Ear: External ear normal.  Left Ear: External ear normal.  Nose: Nose normal.  Mouth/Throat: Oropharynx is clear and moist.  Eyes: Conjunctivae and EOM are normal. Pupils are equal, round, and reactive to light.  Neck: Normal range of motion. Neck supple. No JVD present. No thyromegaly present.  Cardiovascular: Normal rate, regular rhythm, normal heart sounds and intact distal pulses.   Pulmonary/Chest: Effort normal and breath sounds normal.  Abdominal: Soft. Bowel sounds are normal. She exhibits no distension and no  mass. There is no tenderness. There is no rebound and no guarding.  Musculoskeletal: Normal range of motion. She exhibits no edema and no tenderness.  Lymphadenopathy:    She has no cervical adenopathy.  Neurological: She is alert and oriented to person, place, and time. She has normal reflexes. No cranial nerve deficit. Coordination normal.  Skin: Skin is warm and dry. No rash noted. No erythema. No pallor.  CALLUS BIL HEELS, DRY CRACKING  Psychiatric: She has a normal mood and affect. Her behavior is normal. Judgment and thought content normal.     ekg nscspt wnl Diabetic foot exam performed    Assessment & Plan:  1.  3 month F/U for HTN, Cholesterol,DM, D. Deficient. Needs healthy diet, cardio QD and obtain healthy weight. Check Labs, Check BP if >130/80 call office, Check BS if >200 call  office. Long discussion about compliance and increased risks with horrible labs.  2.Chest pain- w/c if SX increase or ER. Refer Cardio with uncontrolled DM  3. Neck pain-NECK ROLL PILLOW, stretches explained OVER 40 minutes of exam, counseling, chart review, referral performed

## 2013-11-20 LAB — INSULIN, FASTING: Insulin fasting, serum: 11 u[IU]/mL (ref 3–28)

## 2013-11-20 LAB — FOLATE RBC: RBC Folate: 599 ng/mL (ref >280)

## 2013-11-28 ENCOUNTER — Encounter: Payer: Self-pay | Admitting: Cardiology

## 2013-11-28 ENCOUNTER — Ambulatory Visit (INDEPENDENT_AMBULATORY_CARE_PROVIDER_SITE_OTHER): Payer: BC Managed Care – PPO | Admitting: Cardiology

## 2013-11-28 VITALS — BP 120/80 | HR 60 | Ht 63.0 in | Wt 158.0 lb

## 2013-11-28 DIAGNOSIS — R079 Chest pain, unspecified: Secondary | ICD-10-CM

## 2013-11-28 NOTE — Progress Notes (Signed)
HPI The patient presents for evaluation of dyspnea. She has no prior cardiac history. However, she does have a history of diabetes. It has not been well controlled with a hemoglobin A1c greater than 10 although I do see that the most recent was 7.4. She has noticed some increasing dyspnea. This comes on sporadically. It might happen with activities such as wrestling with her 42 year old daughter. She might get some sharp chest discomfort as well. She will occasionally get this sharp chest discomfort at night. She might wake up a little breathless as well although she's not describing PND or orthopnea. She's not describing palpitations, presyncope or syncope. She does stay on her feet all day long but doesn't exercise routinely. With her usual activities she's not noticing any limiting symptoms.  No Known Allergies  Current Outpatient Prescriptions  Medication Sig Dispense Refill  . Alogliptin Benzoate (NESINA) 25 MG TABS Take 25 mg by mouth daily.      Marland Kitchen atorvastatin (LIPITOR) 40 MG tablet Take 40 mg by mouth daily. TAKES 1/2      . cholecalciferol (VITAMIN D) 1000 UNITS tablet Take 1,000 Units by mouth daily.      Marland Kitchen glucose monitoring kit (FREESTYLE) monitoring kit 1 each by Does not apply route as needed for other. Please fill strips 1 qd  100 each  5  . lisinopril (PRINIVIL,ZESTRIL) 10 MG tablet Take 10 mg by mouth daily.      Marland Kitchen METFORMIN HCL PO Take 500 mg by mouth 3 (three) times daily.       . mometasone (ELOCON) 0.1 % cream Apply 1 application topically daily.       No current facility-administered medications for this visit.    Past Medical History  Diagnosis Date  . Diabetes mellitus without complication   . Hypertension   . Hyperlipidemia   . Unspecified vitamin D deficiency     Past Surgical History  Procedure Laterality Date  . Tubal ligation Bilateral     Family History  Problem Relation Age of Onset  . Hypertension Mother   . Gout Mother   . Ulcers Mother   .  Hypertension Father   . Diabetes Father   . Ulcers Father   . Kidney disease Father     History   Social History  . Marital Status: Married    Spouse Name: N/A    Number of Children: N/A  . Years of Education: N/A   Occupational History  . Not on file.   Social History Main Topics  . Smoking status: Never Smoker   . Smokeless tobacco: Not on file  . Alcohol Use: No  . Drug Use: Not on file  . Sexual Activity: Not on file   Other Topics Concern  . Not on file   Social History Narrative  . No narrative on file    ROS:  Positive for nocturia. Otherwise as stated in the HPI and negative for all other systems.  PHYSICAL EXAM LMP 11/14/2013 GENERAL:  Well appearing HEENT:  Pupils equal round and reactive, fundi not visualized, oral mucosa unremarkable NECK:  No jugular venous distention, waveform within normal limits, carotid upstroke brisk and symmetric, no bruits, no thyromegaly LYMPHATICS:  No cervical, inguinal adenopathy LUNGS:  Clear to auscultation bilaterally BACK:  No CVA tenderness CHEST:  Unremarkable HEART:  PMI not displaced or sustained,S1 and S2 within normal limits, no S3, no S4, no clicks, no rubs, no murmurs ABD:  Flat, positive bowel sounds normal in frequency in  pitch, no bruits, no rebound, no guarding, no midline pulsatile mass, no hepatomegaly, no splenomegaly EXT:  2 plus pulses throughout, no edema, no cyanosis no clubbing SKIN:  No rashes no nodules NEURO:  Cranial nerves II through XII grossly intact, motor grossly intact throughout PSYCH:  Cognitively intact, oriented to person place and time   EKG:  Sinus rhythm, rate 85, axis within normal limits, intervals within normal limits, no acute ST-T wave changes. 11/21/13  ASSESSMENT AND PLAN  CHEST PAIN:  Her symptoms are atypical. However, she does have cardiovascular risk factors. I will bring the patient back for a POET (Plain Old Exercise Test). This will allow me to screen for obstructive  coronary disease, risk stratify and very importantly provide a prescription for exercise.  DM:  I applauded her on her improved BS.    RISK REDUCTION:  She has an excellent lipid profile.  We spend quite a bit of time discussing an exercise program.

## 2013-11-28 NOTE — Patient Instructions (Signed)
The current medical regimen is effective;  continue present plan and medications.  Your physician has requested that you have an exercise tolerance test. For further information please visit [email or web address]. Please also follow instruction sheet, as given.   

## 2013-12-03 ENCOUNTER — Telehealth (HOSPITAL_COMMUNITY): Payer: Self-pay

## 2013-12-05 ENCOUNTER — Ambulatory Visit (HOSPITAL_COMMUNITY)
Admission: RE | Admit: 2013-12-05 | Discharge: 2013-12-05 | Disposition: A | Discharge status: Home / Self Care | Payer: BC Managed Care – PPO | Source: Ambulatory Visit | Attending: Cardiovascular Disease | Admitting: Cardiovascular Disease

## 2013-12-05 DIAGNOSIS — R079 Chest pain, unspecified: Secondary | ICD-10-CM | POA: Insufficient documentation

## 2013-12-11 ENCOUNTER — Telehealth: Payer: Self-pay | Admitting: *Deleted

## 2013-12-11 NOTE — Telephone Encounter (Signed)
Note below copied from message from Dr. Antoine PocheHochrein. I placed call to pt to review results. Left message to call back   [place], Tanya NonesMeleka A     James Hochrein, MD     Sent: Thu December 11, 2013 10:56 AM      To: [facility]igger[facility] Triage           Message      No evidence of ischemia.

## 2013-12-15 NOTE — Telephone Encounter (Signed)
Reviewed results of stress testing with pt who stated understanding.

## 2013-12-24 ENCOUNTER — Other Ambulatory Visit: Payer: Self-pay

## 2013-12-29 ENCOUNTER — Other Ambulatory Visit: Payer: Self-pay | Admitting: Emergency Medicine

## 2013-12-29 DIAGNOSIS — D649 Anemia, unspecified: Secondary | ICD-10-CM

## 2013-12-30 ENCOUNTER — Other Ambulatory Visit: Payer: BC Managed Care – HMO

## 2013-12-30 DIAGNOSIS — D649 Anemia, unspecified: Secondary | ICD-10-CM

## 2013-12-30 LAB — CBC WITH DIFFERENTIAL/PLATELET
BASOS ABS: 0 10*3/uL (ref 0.0–0.1)
BASOS PCT: 0 % (ref 0–1)
EOS ABS: 0.1 10*3/uL (ref 0.0–0.7)
Eosinophils Relative: 2 % (ref 0–5)
HCT: 36 % (ref 36.0–46.0)
Hemoglobin: 11.8 g/dL — ABNORMAL LOW (ref 12.0–15.0)
Lymphocytes Relative: 34 % (ref 12–46)
Lymphs Abs: 2.5 10*3/uL (ref 0.7–4.0)
MCH: 24.6 pg — AB (ref 26.0–34.0)
MCHC: 32.8 g/dL (ref 30.0–36.0)
MCV: 75 fL — ABNORMAL LOW (ref 78.0–100.0)
MONOS PCT: 8 % (ref 3–12)
Monocytes Absolute: 0.6 10*3/uL (ref 0.1–1.0)
NEUTROS PCT: 56 % (ref 43–77)
Neutro Abs: 4.1 10*3/uL (ref 1.7–7.7)
PLATELETS: 323 10*3/uL (ref 150–400)
RBC: 4.8 MIL/uL (ref 3.87–5.11)
RDW: 13.4 % (ref 11.5–15.5)
WBC: 7.4 10*3/uL (ref 4.0–10.5)

## 2013-12-30 LAB — IRON AND TIBC
%SAT: 18 % — AB (ref 20–55)
Iron: 67 ug/dL (ref 42–145)
TIBC: 364 ug/dL (ref 250–470)
UIBC: 297 ug/dL (ref 125–400)

## 2013-12-31 LAB — VITAMIN B12: VITAMIN B 12: 254 pg/mL (ref 211–911)

## 2014-01-30 ENCOUNTER — Ambulatory Visit: Payer: Self-pay | Admitting: Emergency Medicine

## 2014-02-23 ENCOUNTER — Encounter: Payer: Self-pay | Admitting: Emergency Medicine

## 2014-02-23 ENCOUNTER — Ambulatory Visit (INDEPENDENT_AMBULATORY_CARE_PROVIDER_SITE_OTHER): Payer: No Typology Code available for payment source | Admitting: Emergency Medicine

## 2014-02-23 VITALS — BP 106/64 | HR 72 | Temp 98.0°F | Resp 18 | Ht 62.0 in | Wt 157.0 lb

## 2014-02-23 DIAGNOSIS — E782 Mixed hyperlipidemia: Secondary | ICD-10-CM

## 2014-02-23 DIAGNOSIS — M5431 Sciatica, right side: Secondary | ICD-10-CM

## 2014-02-23 DIAGNOSIS — E119 Type 2 diabetes mellitus without complications: Secondary | ICD-10-CM

## 2014-02-23 DIAGNOSIS — M5430 Sciatica, unspecified side: Secondary | ICD-10-CM

## 2014-02-23 LAB — CBC WITH DIFFERENTIAL/PLATELET
BASOS PCT: 0 % (ref 0–1)
Basophils Absolute: 0 10*3/uL (ref 0.0–0.1)
EOS ABS: 0.1 10*3/uL (ref 0.0–0.7)
Eosinophils Relative: 1 % (ref 0–5)
HCT: 35.2 % — ABNORMAL LOW (ref 36.0–46.0)
Hemoglobin: 11.7 g/dL — ABNORMAL LOW (ref 12.0–15.0)
Lymphocytes Relative: 42 % (ref 12–46)
Lymphs Abs: 2.5 10*3/uL (ref 0.7–4.0)
MCH: 24.5 pg — AB (ref 26.0–34.0)
MCHC: 33.2 g/dL (ref 30.0–36.0)
MCV: 73.6 fL — ABNORMAL LOW (ref 78.0–100.0)
Monocytes Absolute: 0.4 10*3/uL (ref 0.1–1.0)
Monocytes Relative: 7 % (ref 3–12)
NEUTROS PCT: 50 % (ref 43–77)
Neutro Abs: 3 10*3/uL (ref 1.7–7.7)
PLATELETS: 371 10*3/uL (ref 150–400)
RBC: 4.78 MIL/uL (ref 3.87–5.11)
RDW: 14.2 % (ref 11.5–15.5)
WBC: 5.9 10*3/uL (ref 4.0–10.5)

## 2014-02-23 LAB — HEMOGLOBIN A1C
HEMOGLOBIN A1C: 8.4 % — AB (ref <5.7)
Mean Plasma Glucose: 194 mg/dL — ABNORMAL HIGH (ref <117)

## 2014-02-23 MED ORDER — MOMETASONE FUROATE 0.1 % EX CREA: 1.0000 "application " | TOPICAL_CREAM | Freq: Every day | CUTANEOUS | Status: DC

## 2014-02-23 NOTE — Progress Notes (Signed)
Subjective:    Patient ID: Joanna Marsh, female    DOB: August 23, 1971, 43 y.o.   MRN: [id number]  HPI Comments: 57 AAF presents for 3 month F/U for Cholesterol, DM, D. Deficient. She is eating healthy. She is exercising sparingly. She checks feet routinely and denies skin break down or neuropathy increase. She is on Lisinopril for kidney protection with DM, she does NOT have HTN.  WBC             7.4   12/30/2013 HGB            11.8   12/30/2013 HCT            36.0   12/30/2013 PLT             323   12/30/2013 GLUCOSE         132   11/19/2013 CHOL            111   11/19/2013 TRIG             52   11/19/2013 HDL              47   11/19/2013 LDLCALC          54   11/19/2013 ALT              20   11/19/2013 AST              23   11/19/2013 NA              139   11/19/2013 K               4.0   11/19/2013 CL              102   11/19/2013 CREATININE     0.66   11/19/2013 BUN               6   11/19/2013 CO2              30   11/19/2013 HGBA1C          7.4   11/19/2013   Hyperlipidemia  Hypertension  Diabetes     Medication List       This list is accurate as of: 02/23/14 12:05 PM.  Always use your most recent med list.               atorvastatin 40 MG tablet  Commonly known as:  LIPITOR  Take 40 mg by mouth daily. TAKES 1/2     cholecalciferol 1000 UNITS tablet  Commonly known as:  VITAMIN D  Take 1,000 Units by mouth daily.     glucose monitoring kit monitoring kit  1 each by Does not apply route as needed for other. Please fill strips 1 qd     lisinopril 10 MG tablet  Commonly known as:  PRINIVIL,ZESTRIL  Take 10 mg by mouth daily.     METFORMIN HCL PO  Take 500 mg by mouth 3 (three) times daily.     mometasone 0.1 % cream  Commonly known as:  ELOCON  Apply 1 application topically daily.     NESINA 25 MG Tabs  Generic drug:  Alogliptin Benzoate  Take 25 mg by mouth daily.       No Known Allergies  Past Medical History  Diagnosis Date  . Diabetes mellitus without  complication   . Unspecified vitamin D deficiency       Review  of Systems  All other systems reviewed and are negative.  BP 106/64  Pulse 72  Temp(Src) 98 F (36.7 C) (Temporal)  Resp 18  Ht 5' 2"  (1.575 m)  Wt 157 lb (71.215 kg)  BMI 28.71 kg/m2  LMP 02/01/2014     Objective:   Physical Exam  Nursing note and vitals reviewed. Constitutional: She is oriented to person, place, and time. She appears well-developed and well-nourished. No distress.  HENT:  Head: Normocephalic and atraumatic.  Right Ear: External ear normal.  Left Ear: External ear normal.  Nose: Nose normal.  Mouth/Throat: Oropharynx is clear and moist.  Eyes: Conjunctivae and EOM are normal.  Neck: Normal range of motion. Neck supple. No JVD present. No thyromegaly present.  Cardiovascular: Normal rate, regular rhythm, normal heart sounds and intact distal pulses.   Pulmonary/Chest: Effort normal and breath sounds normal.  Abdominal: Soft. Bowel sounds are normal. She exhibits no distension. There is no tenderness.  Musculoskeletal: Normal range of motion. She exhibits no edema and no tenderness.  Lymphadenopathy:    She has no cervical adenopathy.  Neurological: She is alert and oriented to person, place, and time. No cranial nerve deficit.  Skin: Skin is warm and dry. No rash noted. No erythema. No pallor.  Psychiatric: She has a normal mood and affect. Her behavior is normal. Judgment and thought content normal.          Assessment & Plan:  1.  3 month F/U for Cholesterol,DM, D. Deficient. Needs healthy diet, cardio QD and obtain healthy weight. Check Labs, Check BP if >130/80 call office, Check BS if >200 call office

## 2014-02-23 NOTE — Patient Instructions (Signed)
Sciatica with Rehab The sciatic nerve runs from the back down the leg and is responsible for sensation and control of the muscles in the back (posterior) side of the thigh, lower leg, and foot. Sciatica is a condition that is characterized by inflammation of this nerve.  SYMPTOMS   Signs of nerve damage, including numbness and/or weakness along the posterior side of the lower extremity.  Pain in the back of the thigh that may also travel down the leg.  Pain that worsens when sitting for long periods of time.  Occasionally, pain in the back or buttock. CAUSES  Inflammation of the sciatic nerve is the cause of sciatica. The inflammation is due to something irritating the nerve. Common sources of irritation include:  Sitting for long periods of time.  Direct trauma to the nerve.  Arthritis of the spine.  Herniated or ruptured disk.  Slipping of the vertebrae (spondylolithesis)  Pressure from soft tissues, such as muscles or ligament-like tissue (fascia). RISK INCREASES WITH:  Sports that place pressure or stress on the spine (football or weightlifting).  Poor strength and flexibility.  Failure to warm-up properly before activity.  Family history of low back pain or disk disorders.  Previous back injury or surgery.  Poor body mechanics, especially when lifting, or poor posture. PREVENTION   Warm up and stretch properly before activity.  Maintain physical fitness:  Strength, flexibility, and endurance.  Cardiovascular fitness.  Learn and use proper technique, especially with posture and lifting. When possible, have coach correct improper technique.  Avoid activities that place stress on the spine. PROGNOSIS If treated properly, then sciatica usually resolves within 6 weeks. However, occasionally surgery is necessary.  RELATED COMPLICATIONS   Permanent nerve damage, including pain, numbness, tingle, or weakness.  Chronic back pain.  Risks of surgery: infection,  bleeding, nerve damage, or damage to surrounding tissues. TREATMENT Treatment initially involves resting from any activities that aggravate your symptoms. The use of ice and medication may help reduce pain and inflammation. The use of strengthening and stretching exercises may help reduce pain with activity. These exercises may be performed at home or with referral to a therapist. A therapist may recommend further treatments, such as transcutaneous electronic nerve stimulation (TENS) or ultrasound. Your caregiver may recommend corticosteroid injections to help reduce inflammation of the sciatic nerve. If symptoms persist despite non-surgical (conservative) treatment, then surgery may be recommended. MEDICATION  If pain medication is necessary, then nonsteroidal anti-inflammatory medications, such as aspirin and ibuprofen, or other minor pain relievers, such as acetaminophen, are often recommended.  Do not take pain medication for 7 days before surgery.  Prescription pain relievers may be given if deemed necessary by your caregiver. Use only as directed and only as much as you need.  Ointments applied to the skin may be helpful.  Corticosteroid injections may be given by your caregiver. These injections should be reserved for the most serious cases, because they may only be given a certain number of times. HEAT AND COLD  Cold treatment (icing) relieves pain and reduces inflammation. Cold treatment should be applied for 10 to 15 minutes every 2 to 3 hours for inflammation and pain and immediately after any activity that aggravates your symptoms. Use ice packs or massage the area with a piece of ice (ice massage).  Heat treatment may be used prior to performing the stretching and strengthening activities prescribed by your caregiver, physical therapist, or athletic trainer. Use a heat pack or soak the injury in warm water. SEEK   MEDICAL CARE IF:  Treatment seems to offer no benefit, or the condition  worsens.  Any medications produce adverse side effects. EXERCISES  RANGE OF MOTION (ROM) AND STRETCHING EXERCISES - Sciatica Most people with sciatic will find that their symptoms worsen with either excessive bending forward (flexion) or arching at the low back (extension). The exercises which will help resolve your symptoms will focus on the opposite motion. Your physician, physical therapist or athletic trainer will help you determine which exercises will be most helpful to resolve your low back pain. Do not complete any exercises without first consulting with your clinician. Discontinue any exercises which worsen your symptoms until you speak to your clinician. If you have pain, numbness or tingling which travels down into your buttocks, leg or foot, the goal of the therapy is for these symptoms to move closer to your back and eventually resolve. Occasionally, these leg symptoms will get better, but your low back pain may worsen; this is typically an indication of progress in your rehabilitation. Be certain to be very alert to any changes in your symptoms and the activities in which you participated in the 24 hours prior to the change. Sharing this information with your clinician will allow him/her to most efficiently treat your condition. These exercises may help you when beginning to rehabilitate your injury. Your symptoms may resolve with or without further involvement from your physician, physical therapist or athletic trainer. While completing these exercises, remember:   Restoring tissue flexibility helps normal motion to return to the joints. This allows healthier, less painful movement and activity.  An effective stretch should be held for at least 30 seconds.  A stretch should never be painful. You should only feel a gentle lengthening or release in the stretched tissue. FLEXION RANGE OF MOTION AND STRETCHING EXERCISES: STRETCH - Flexion, Single Knee to Chest   Lie on a firm bed or floor  with both legs extended in front of you.  Keeping one leg in contact with the floor, bring your opposite knee to your chest. Hold your leg in place by either grabbing behind your thigh or at your knee.  Pull until you feel a gentle stretch in your low back. Hold __________ seconds.  Slowly release your grasp and repeat the exercise with the opposite side. Repeat __________ times. Complete this exercise __________ times per day.  STRETCH - Flexion, Double Knee to Chest  Lie on a firm bed or floor with both legs extended in front of you.  Keeping one leg in contact with the floor, bring your opposite knee to your chest.  Tense your stomach muscles to support your back and then lift your other knee to your chest. Hold your legs in place by either grabbing behind your thighs or at your knees.  Pull both knees toward your chest until you feel a gentle stretch in your low back. Hold __________ seconds.  Tense your stomach muscles and slowly return one leg at a time to the floor. Repeat __________ times. Complete this exercise __________ times per day.  STRETCH - Low Trunk Rotation   Lie on a firm bed or floor. Keeping your legs in front of you, bend your knees so they are both pointed toward the ceiling and your feet are flat on the floor.  Extend your arms out to the side. This will stabilize your upper body by keeping your shoulders in contact with the floor.  Gently and slowly drop both knees together to one side until you   feel a gentle stretch in your low back. Hold for __________ seconds.  Tense your stomach muscles to support your low back as you bring your knees back to the starting position. Repeat the exercise to the other side. Repeat __________ times. Complete this exercise __________ times per day  EXTENSION RANGE OF MOTION AND FLEXIBILITY EXERCISES: STRETCH - Extension, Prone on Elbows  Lie on your stomach on the floor, a bed will be too soft. Place your palms about shoulder  width apart and at the height of your head.  Place your elbows under your shoulders. If this is too painful, stack pillows under your chest.  Allow your body to relax so that your hips drop lower and make contact more completely with the floor.  Hold this position for __________ seconds.  Slowly return to lying flat on the floor. Repeat __________ times. Complete this exercise __________ times per day.  RANGE OF MOTION - Extension, Prone Press Ups  Lie on your stomach on the floor, a bed will be too soft. Place your palms about shoulder width apart and at the height of your head.  Keeping your back as relaxed as possible, slowly straighten your elbows while keeping your hips on the floor. You may adjust the placement of your hands to maximize your comfort. As you gain motion, your hands will come more underneath your shoulders.  Hold this position __________ seconds.  Slowly return to lying flat on the floor. Repeat __________ times. Complete this exercise __________ times per day.  STRENGTHENING EXERCISES - Sciatica  These exercises may help you when beginning to rehabilitate your injury. These exercises should be done near your "sweet spot." This is the neutral, low-back arch, somewhere between fully rounded and fully arched, that is your least painful position. When performed in this safe range of motion, these exercises can be used for people who have either a flexion or extension based injury. These exercises may resolve your symptoms with or without further involvement from your physician, physical therapist or athletic trainer. While completing these exercises, remember:   Muscles can gain both the endurance and the strength needed for everyday activities through controlled exercises.  Complete these exercises as instructed by your physician, physical therapist or athletic trainer. Progress with the resistance and repetition exercises only as your caregiver advises.  You may  experience muscle soreness or fatigue, but the pain or discomfort you are trying to eliminate should never worsen during these exercises. If this pain does worsen, stop and make certain you are following the directions exactly. If the pain is still present after adjustments, discontinue the exercise until you can discuss the trouble with your clinician. STRENGTHENING - Deep Abdominals, Pelvic Tilt   Lie on a firm bed or floor. Keeping your legs in front of you, bend your knees so they are both pointed toward the ceiling and your feet are flat on the floor.  Tense your lower abdominal muscles to press your low back into the floor. This motion will rotate your pelvis so that your tail bone is scooping upwards rather than pointing at your feet or into the floor.  With a gentle tension and even breathing, hold this position for __________ seconds. Repeat __________ times. Complete this exercise __________ times per day.  STRENGTHENING - Abdominals, Crunches   Lie on a firm bed or floor. Keeping your legs in front of you, bend your knees so they are both pointed toward the ceiling and your feet are flat on the floor.   Cross your arms over your chest.  Slightly tip your chin down without bending your neck.  Tense your abdominals and slowly lift your trunk high enough to just clear your shoulder blades. Lifting higher can put excessive stress on the low back and does not further strengthen your abdominal muscles.  Control your return to the starting position. Repeat __________ times. Complete this exercise __________ times per day.  STRENGTHENING - Quadruped, Opposite UE/LE Lift  Assume a hands and knees position on a firm surface. Keep your hands under your shoulders and your knees under your hips. You may place padding under your knees for comfort.  Find your neutral spine and gently tense your abdominal muscles so that you can maintain this position. Your shoulders and hips should form a rectangle  that is parallel with the floor and is not twisted.  Keeping your trunk steady, lift your right hand no higher than your shoulder and then your left leg no higher than your hip. Make sure you are not holding your breath. Hold this position __________ seconds.  Continuing to keep your abdominal muscles tense and your back steady, slowly return to your starting position. Repeat with the opposite arm and leg. Repeat __________ times. Complete this exercise __________ times per day.  STRENGTHENING - Abdominals and Quadriceps, Straight Leg Raise   Lie on a firm bed or floor with both legs extended in front of you.  Keeping one leg in contact with the floor, bend the other knee so that your foot can rest flat on the floor.  Find your neutral spine, and tense your abdominal muscles to maintain your spinal position throughout the exercise.  Slowly lift your straight leg off the floor about 6 inches for a count of 15, making sure to not hold your breath.  Still keeping your neutral spine, slowly lower your leg all the way to the floor. Repeat this exercise with each leg __________ times. Complete this exercise __________ times per day. POSTURE AND BODY MECHANICS CONSIDERATIONS - Sciatica Keeping correct posture when sitting, standing or completing your activities will reduce the stress put on different body tissues, allowing injured tissues a chance to heal and limiting painful experiences. The following are general guidelines for improved posture. Your physician or physical therapist will provide you with any instructions specific to your needs. While reading these guidelines, remember:  The exercises prescribed by your provider will help you have the flexibility and strength to maintain correct postures.  The correct posture provides the optimal environment for your joints to work. All of your joints have less wear and tear when properly supported by a spine with good posture. This means you will  experience a healthier, less painful body.  Correct posture must be practiced with all of your activities, especially prolonged sitting and standing. Correct posture is as important when doing repetitive low-stress activities (typing) as it is when doing a single heavy-load activity (lifting). RESTING POSITIONS Consider which positions are most painful for you when choosing a resting position. If you have pain with flexion-based activities (sitting, bending, stooping, squatting), choose a position that allows you to rest in a less flexed posture. You would want to avoid curling into a fetal position on your side. If your pain worsens with extension-based activities (prolonged standing, working overhead), avoid resting in an extended position such as sleeping on your stomach. Most people will find more comfort when they rest with their spine in a more neutral position, neither too rounded nor too arched.   Lying on a non-sagging bed on your side with a pillow between your knees, or on your back with a pillow under your knees will often provide some relief. Keep in mind, being in any one position for a prolonged period of time, no matter how correct your posture, can still lead to stiffness. PROPER SITTING POSTURE In order to minimize stress and discomfort on your spine, you must sit with correct posture Sitting with good posture should be effortless for a healthy body. Returning to good posture is a gradual process. Many people can work toward this most comfortably by using various supports until they have the flexibility and strength to maintain this posture on their own. When sitting with proper posture, your ears will fall over your shoulders and your shoulders will fall over your hips. You should use the back of the chair to support your upper back. Your low back will be in a neutral position, just slightly arched. You may place a small pillow or folded towel at the base of your low back for support.  When  working at a desk, create an environment that supports good, upright posture. Without extra support, muscles fatigue and lead to excessive strain on joints and other tissues. Keep these recommendations in mind: CHAIR:   A chair should be able to slide under your desk when your back makes contact with the back of the chair. This allows you to work closely.  The chair's height should allow your eyes to be level with the upper part of your monitor and your hands to be slightly lower than your elbows. BODY POSITION  Your feet should make contact with the floor. If this is not possible, use a foot rest.  Keep your ears over your shoulders. This will reduce stress on your neck and low back. INCORRECT SITTING POSTURES   If you are feeling tired and unable to assume a healthy sitting posture, do not slouch or slump. This puts excessive strain on your back tissues, causing more damage and pain. Healthier options include:  Using more support, like a lumbar pillow.  Switching tasks to something that requires you to be upright or walking.  Talking a brief walk.  Lying down to rest in a neutral-spine position. PROLONGED STANDING WHILE SLIGHTLY LEANING FORWARD  When completing a task that requires you to lean forward while standing in one place for a long time, place either foot up on a stationary 2-4 inch high object to help maintain the best posture. When both feet are on the ground, the low back tends to lose its slight inward curve. If this curve flattens (or becomes too large), then the back and your other joints will experience too much stress, fatigue more quickly and can cause pain.  CORRECT STANDING POSTURES Proper standing posture should be assumed with all daily activities, even if they only take a few moments, like when brushing your teeth. As in sitting, your ears should fall over your shoulders and your shoulders should fall over your hips. You should keep a slight tension in your abdominal  muscles to brace your spine. Your tailbone should point down to the ground, not behind your body, resulting in an over-extended swayback posture.  INCORRECT STANDING POSTURES  Common incorrect standing postures include a forward head, locked knees and/or an excessive swayback. WALKING Walk with an upright posture. Your ears, shoulders and hips should all line-up. PROLONGED ACTIVITY IN A FLEXED POSITION When completing a task that requires you to bend forward at   your waist or lean over a low surface, try to find a way to stabilize 3 of 4 of your limbs. You can place a hand or elbow on your thigh or rest a knee on the surface you are reaching across. This will provide you more stability so that your muscles do not fatigue as quickly. By keeping your knees relaxed, or slightly bent, you will also reduce stress across your low back. CORRECT LIFTING TECHNIQUES DO :   Assume a wide stance. This will provide you more stability and the opportunity to get as close as possible to the object which you are lifting.  Tense your abdominals to brace your spine; then bend at the knees and hips. Keeping your back locked in a neutral-spine position, lift using your leg muscles. Lift with your legs, keeping your back straight.  Test the weight of unknown objects before attempting to lift them.  Try to keep your elbows locked down at your sides in order get the best strength from your shoulders when carrying an object.  Always ask for help when lifting heavy or awkward objects. INCORRECT LIFTING TECHNIQUES DO NOT:   Lock your knees when lifting, even if it is a small object.  Bend and twist. Pivot at your feet or move your feet when needing to change directions.  Assume that you cannot safely pick up a paperclip without proper posture. Document Released: 08/21/2005 Document Revised: 11/13/2011 Document Reviewed: 12/03/2008 [employer] Patient Information 2015 [employer]. This information is not intended to  replace advice given to you by your health care provider. Make sure you discuss any questions you have with your health care provider.  

## 2014-02-24 LAB — LIPID PANEL
CHOL/HDL RATIO: 3.1 ratio
Cholesterol: 147 mg/dL (ref 0–200)
HDL: 47 mg/dL (ref >39)
LDL CALC: 89 mg/dL (ref 0–99)
Triglycerides: 55 mg/dL (ref <150)
VLDL: 11 mg/dL (ref 0–40)

## 2014-02-24 LAB — HEPATIC FUNCTION PANEL
ALK PHOS: 42 U/L (ref 39–117)
ALT: 19 U/L (ref 0–35)
AST: 19 U/L (ref 0–37)
Albumin: 4.2 g/dL (ref 3.5–5.2)
BILIRUBIN INDIRECT: 0.5 mg/dL (ref 0.2–1.2)
Bilirubin, Direct: 0.2 mg/dL (ref 0.0–0.3)
TOTAL PROTEIN: 7.1 g/dL (ref 6.0–8.3)
Total Bilirubin: 0.7 mg/dL (ref 0.2–1.2)

## 2014-02-24 LAB — BASIC METABOLIC PANEL WITH GFR
BUN: 12 mg/dL (ref 6–23)
CALCIUM: 9.8 mg/dL (ref 8.4–10.5)
CO2: 25 meq/L (ref 19–32)
Chloride: 102 mEq/L (ref 96–112)
Creat: 0.72 mg/dL (ref 0.50–1.10)
GFR, Est Non African American: >89 mL/min
Glucose, Bld: 205 mg/dL — ABNORMAL HIGH (ref 70–99)
Potassium: 4.4 mEq/L (ref 3.5–5.3)
SODIUM: 135 meq/L (ref 135–145)

## 2014-02-24 LAB — INSULIN, FASTING: INSULIN FASTING, SERUM: 12 u[IU]/mL (ref 3–28)

## 2014-03-05 NOTE — Telephone Encounter (Signed)
Encounter complete. 

## 2014-04-02 ENCOUNTER — Other Ambulatory Visit: Payer: Self-pay | Admitting: Internal Medicine

## 2014-06-16 ENCOUNTER — Other Ambulatory Visit: Payer: Self-pay | Admitting: Internal Medicine

## 2014-06-17 ENCOUNTER — Encounter: Payer: Self-pay | Admitting: Physician Assistant

## 2014-06-17 ENCOUNTER — Ambulatory Visit (INDEPENDENT_AMBULATORY_CARE_PROVIDER_SITE_OTHER): Payer: BC Managed Care – HMO | Admitting: Physician Assistant

## 2014-06-17 VITALS — BP 126/72 | HR 86 | Temp 98.6°F | Resp 18 | Ht 62.0 in | Wt 163.0 lb

## 2014-06-17 DIAGNOSIS — E559 Vitamin D deficiency, unspecified: Secondary | ICD-10-CM

## 2014-06-17 DIAGNOSIS — E119 Type 2 diabetes mellitus without complications: Secondary | ICD-10-CM

## 2014-06-17 DIAGNOSIS — I10 Essential (primary) hypertension: Secondary | ICD-10-CM

## 2014-06-17 DIAGNOSIS — Z79899 Other long term (current) drug therapy: Secondary | ICD-10-CM

## 2014-06-17 DIAGNOSIS — E785 Hyperlipidemia, unspecified: Secondary | ICD-10-CM

## 2014-06-17 LAB — CBC WITH DIFFERENTIAL/PLATELET
BASOS ABS: 0 10*3/uL (ref 0.0–0.1)
BASOS PCT: 0 % (ref 0–1)
EOS PCT: 1 % (ref 0–5)
Eosinophils Absolute: 0.1 10*3/uL (ref 0.0–0.7)
HCT: 35.7 % — ABNORMAL LOW (ref 36.0–46.0)
Hemoglobin: 12 g/dL (ref 12.0–15.0)
LYMPHS PCT: 37 % (ref 12–46)
Lymphs Abs: 2.1 10*3/uL (ref 0.7–4.0)
MCH: 25.3 pg — ABNORMAL LOW (ref 26.0–34.0)
MCHC: 33.6 g/dL (ref 30.0–36.0)
MCV: 75.2 fL — ABNORMAL LOW (ref 78.0–100.0)
Monocytes Absolute: 0.5 10*3/uL (ref 0.1–1.0)
Monocytes Relative: 8 % (ref 3–12)
NEUTROS ABS: 3.1 10*3/uL (ref 1.7–7.7)
Neutrophils Relative %: 54 % (ref 43–77)
PLATELETS: 308 10*3/uL (ref 150–400)
RBC: 4.75 MIL/uL (ref 3.87–5.11)
RDW: 14.7 % (ref 11.5–15.5)
WBC: 5.8 10*3/uL (ref 4.0–10.5)

## 2014-06-17 NOTE — Patient Instructions (Signed)
Benefiber is good for constipation/diarrhea/irritable bowel syndrome, it helps with weight loss and can help lower your bad cholesterol. Please do 1-2 TBSP in the morning in water, coffee, or tea. It can take up to a month before you can see a difference with your bowel movements. It is cheapest from [employer], [employer], [employer].     Bad carbs also include fruit juice, alcohol, and sweet tea. These are empty calories that do not signal to your brain that you are full.   Please remember the good carbs are still carbs which convert into sugar. So please measure them out no more than 1/2-1 cup of rice, oatmeal, pasta, and beans.  Veggies are however free foods! Pile them on.   I like lean protein at every meal such as chicken, turkey, pork chops, cottage cheese, etc. Just do not fry these meats and please center your meal around vegetable, the meats should be a side dish.   No all fruit is created equal. Please see the list below, the fruit at the bottom is higher in sugars than the fruit at the top    

## 2014-06-17 NOTE — Progress Notes (Signed)
Assessment and Plan:  Hypertension: Continue medication, monitor blood pressure at home. Continue DASH diet. Cholesterol: Continue diet and exercise. Check cholesterol.  Diabetes-Continue diet and exercise. Check A1C Vitamin D Def- check level and continue medications.   Continue diet and meds as discussed. Further disposition pending results of labs. Discussed med's effects and SE's.    HPI 43 y.o. female  presents for 3 month follow up with hypertension, hyperlipidemia, diabetes and vitamin D. Her blood pressure has been controlled at home, today their BP is BP: 126/72 mmHg She does not workout. She denies chest pain, shortness of breath, dizziness.  She is on cholesterol medication and denies myalgias. Her cholesterol is at goal. The cholesterol last visit was:   Lab Results  Component Value Date   CHOL 147 02/23/2014   HDL 47 02/23/2014   LDLCALC 89 02/23/2014   TRIG 55 02/23/2014   CHOLHDL 3.1 02/23/2014   She has been working on diet and exercise for Diabetes, and denies increased appetite, paresthesia of the feet, polydipsia and polyuria. Last A1C in the office was:  Lab Results  Component Value Date   HGBA1C 8.4* 02/23/2014   Patient is on Vitamin D supplement. Lab Results  Component Value Date   VD25OH 34 07/11/2013      Current Medications:  Current Outpatient Prescriptions on File Prior to Visit  Medication Sig Dispense Refill  . atorvastatin (LIPITOR) 40 MG tablet Take 40 mg by mouth daily. TAKES 1/2      . glucose monitoring kit (FREESTYLE) monitoring kit 1 each by Does not apply route as needed for other. Please fill strips 1 qd  100 each  5  . lisinopril (PRINIVIL,ZESTRIL) 10 MG tablet TAKE ONE TABLET BY MOUTH ONCE DAILY FOR BLOOD PRESSURE AND KIDNEYS  90 tablet  0  . metFORMIN (GLUCOPHAGE-XR) 500 MG 24 hr tablet TAKE TWO TABLETS BY MOUTH TWICE DAILY FOR DIABETES  120 tablet  0  . mometasone (ELOCON) 0.1 % cream Apply 1 application topically daily.  50 g  2  . NESINA  25 MG TABS TAKE ONE TABLET BY MOUTH ONCE DAILY FOR DIABETES  30 tablet  3   No current facility-administered medications on file prior to visit.   Medical History:  Past Medical History  Diagnosis Date  . Diabetes mellitus without complication   . Unspecified vitamin D deficiency    Allergies: No Known Allergies   Review of Systems: _0  = complains of  _1  = denies  General: Fatigue _2  Fever _3  Chills _4  Weakness _5   Insomnia _6  Eyes: Redness _7  Blurred vision _8  Diplopia _9   ENT: Congestion _10  Sinus Pain _11  Post Nasal Drip _12  Sore Throat _13  Earache _14   Cardiac: Chest pain/pressure _15  SOB _16  Orthopnea _17   Palpitations _18   Paroxysmal nocturnal dyspnea_19  Claudication _20  Edema _21   Pulmonary: Cough _22  Wheezing_23   SOB _24   Snoring _25   GI: Nausea _26  Vomiting_27  Dysphagia_28  Heartburn_29  Abdominal pain _30  Constipation _31 ; Diarrhea _32 ; BRBPR _33  Melena_34  GU: Hematuria_35  Dysuria _36  Nocturia_37  Urgency _38   Hesitancy _39  Discharge _40  Neuro: Headaches_41  Vertigo_42  Paresthesias_43  Spasm _44  Speech changes _45  Incoordination _46   Ortho: Arthritis _47  Joint pain _48  Muscle pain _49  Joint swelling _50  Back Pain _51   Skin:  Rash _0   Pruritis _1  Change in skin lesion _2   Psych: Depression_3  Anxiety_4  Confusion _5  Memory loss _6   Heme/Lypmh: Bleeding _7  Bruising _8  Enlarged lymph nodes _9   Endocrine: Visual blurring _10  Paresthesia _11  Polyuria _12  Polydypsea _13    Heat/cold intolerance _14  Hypoglycemia _15   Family history- Review and unchanged Social history- Review and unchanged Physical Exam: BP 126/72  Pulse 86  Temp(Src) 98.6 F (37 C) (Temporal)  Resp 18  Ht _16  (1.575 m)  Wt 163 lb (73.936 kg)  BMI 29.81 kg/m2  LMP 06/03/2014 Wt Readings from Last 3 Encounters:  06/17/14 163 lb (73.936 kg)  02/23/14 157 lb (71.215 kg)  11/28/13 158 lb (71.668 kg)   General Appearance: Well nourished, in no apparent distress. Eyes: PERRLA, EOMs,  conjunctiva no swelling or erythema Sinuses: No Frontal/maxillary tenderness ENT/Mouth: Ext aud canals clear, TMs without erythema, bulging. No erythema, swelling, or exudate on post pharynx.  Tonsils not swollen or erythematous. Hearing normal.  Neck: Supple, thyroid normal.  Respiratory: Respiratory effort normal, BS equal bilaterally without rales, rhonchi, wheezing or stridor.  Cardio: RRR with no MRGs. Brisk peripheral pulses without edema.  Abdomen: Soft, + BS.  Non tender, no guarding, rebound, hernias, masses. Lymphatics: Non tender without lymphadenopathy.  Musculoskeletal: Full ROM, 5/5 strength, normal gait.  Skin: Warm, dry without rashes, lesions, ecchymosis.  Neuro: Cranial nerves intact. No cerebellar symptoms. Sensation intact.  Psych: Awake and oriented X 3, normal affect, Insight and Judgment appropriate.    Vicie Mutters 12:39 PM

## 2014-06-18 LAB — HEPATIC FUNCTION PANEL
ALBUMIN: 4.1 g/dL (ref 3.5–5.2)
ALK PHOS: 38 U/L — AB (ref 39–117)
ALT: 13 U/L (ref 0–35)
AST: 13 U/L (ref 0–37)
BILIRUBIN TOTAL: 0.4 mg/dL (ref 0.2–1.2)
Bilirubin, Direct: 0.1 mg/dL (ref 0.0–0.3)
Indirect Bilirubin: 0.3 mg/dL (ref 0.2–1.2)
Total Protein: 6.9 g/dL (ref 6.0–8.3)

## 2014-06-18 LAB — BASIC METABOLIC PANEL WITH GFR
BUN: 9 mg/dL (ref 6–23)
CHLORIDE: 104 meq/L (ref 96–112)
CO2: 26 mEq/L (ref 19–32)
CREATININE: 0.73 mg/dL (ref 0.50–1.10)
Calcium: 9.6 mg/dL (ref 8.4–10.5)
Glucose, Bld: 167 mg/dL — ABNORMAL HIGH (ref 70–99)
Potassium: 3.9 mEq/L (ref 3.5–5.3)
Sodium: 139 mEq/L (ref 135–145)

## 2014-06-18 LAB — INSULIN, FASTING: INSULIN FASTING, SERUM: 34.6 u[IU]/mL — AB (ref 2.0–19.6)

## 2014-06-18 LAB — LIPID PANEL
CHOL/HDL RATIO: 3 ratio
Cholesterol: 130 mg/dL (ref 0–200)
HDL: 44 mg/dL (ref >39)
LDL Cholesterol: 65 mg/dL (ref 0–99)
Triglycerides: 105 mg/dL (ref <150)
VLDL: 21 mg/dL (ref 0–40)

## 2014-06-18 LAB — HEMOGLOBIN A1C
HEMOGLOBIN A1C: 8.2 % — AB (ref <5.7)
MEAN PLASMA GLUCOSE: 189 mg/dL — AB (ref <117)

## 2014-06-18 LAB — MAGNESIUM: MAGNESIUM: 1.6 mg/dL (ref 1.5–2.5)

## 2014-06-18 LAB — TSH: TSH: 0.595 u[IU]/mL (ref 0.350–4.500)

## 2014-06-18 LAB — VITAMIN D 25 HYDROXY (VIT D DEFICIENCY, FRACTURES): Vit D, 25-Hydroxy: 24 ng/mL — ABNORMAL LOW (ref 30–89)

## 2014-07-05 ENCOUNTER — Other Ambulatory Visit: Payer: Self-pay | Admitting: Physician Assistant

## 2014-07-13 ENCOUNTER — Other Ambulatory Visit: Payer: Self-pay | Admitting: *Deleted

## 2014-07-13 MED ORDER — ATORVASTATIN CALCIUM 40 MG PO TABS: 40.0000 mg | ORAL_TABLET | Freq: Every day | ORAL | Status: DC

## 2014-07-20 ENCOUNTER — Other Ambulatory Visit: Payer: Self-pay | Admitting: *Deleted

## 2014-07-20 MED ORDER — ATORVASTATIN CALCIUM 40 MG PO TABS: 40.0000 mg | ORAL_TABLET | Freq: Every day | ORAL | Status: DC

## 2014-09-23 ENCOUNTER — Ambulatory Visit: Payer: Self-pay | Admitting: Physician Assistant

## 2014-10-23 ENCOUNTER — Other Ambulatory Visit: Payer: Self-pay | Admitting: Physician Assistant

## 2014-11-02 ENCOUNTER — Other Ambulatory Visit: Payer: Self-pay | Admitting: Physician Assistant

## 2014-11-12 ENCOUNTER — Encounter: Payer: Self-pay | Admitting: Physician Assistant

## 2014-11-12 ENCOUNTER — Ambulatory Visit (INDEPENDENT_AMBULATORY_CARE_PROVIDER_SITE_OTHER): Payer: BLUE CROSS/BLUE SHIELD | Admitting: Physician Assistant

## 2014-11-12 VITALS — BP 110/78 | HR 68 | Temp 97.7°F | Resp 16 | Ht 62.0 in | Wt 159.0 lb

## 2014-11-12 DIAGNOSIS — R200 Anesthesia of skin: Secondary | ICD-10-CM

## 2014-11-12 DIAGNOSIS — R202 Paresthesia of skin: Secondary | ICD-10-CM

## 2014-11-12 DIAGNOSIS — E119 Type 2 diabetes mellitus without complications: Secondary | ICD-10-CM

## 2014-11-12 DIAGNOSIS — E785 Hyperlipidemia, unspecified: Secondary | ICD-10-CM

## 2014-11-12 DIAGNOSIS — Z79899 Other long term (current) drug therapy: Secondary | ICD-10-CM

## 2014-11-12 DIAGNOSIS — R1011 Right upper quadrant pain: Secondary | ICD-10-CM

## 2014-11-12 DIAGNOSIS — E559 Vitamin D deficiency, unspecified: Secondary | ICD-10-CM

## 2014-11-12 DIAGNOSIS — I10 Essential (primary) hypertension: Secondary | ICD-10-CM

## 2014-11-12 LAB — LIPID PANEL
Cholesterol: 122 mg/dL (ref 0–200)
HDL: 47 mg/dL (ref >=46)
LDL Cholesterol: 60 mg/dL (ref 0–99)
Total CHOL/HDL Ratio: 2.6 Ratio
Triglycerides: 76 mg/dL (ref <150)
VLDL: 15 mg/dL (ref 0–40)

## 2014-11-12 LAB — CBC WITH DIFFERENTIAL/PLATELET
BASOS ABS: 0 10*3/uL (ref 0.0–0.1)
Basophils Relative: 0 % (ref 0–1)
EOS PCT: 2 % (ref 0–5)
Eosinophils Absolute: 0.1 10*3/uL (ref 0.0–0.7)
HEMATOCRIT: 37.1 % (ref 36.0–46.0)
Hemoglobin: 12.5 g/dL (ref 12.0–15.0)
Lymphocytes Relative: 49 % — ABNORMAL HIGH (ref 12–46)
Lymphs Abs: 2.7 10*3/uL (ref 0.7–4.0)
MCH: 25.8 pg — ABNORMAL LOW (ref 26.0–34.0)
MCHC: 33.7 g/dL (ref 30.0–36.0)
MCV: 76.7 fL — ABNORMAL LOW (ref 78.0–100.0)
MPV: 8.7 fL (ref 8.6–12.4)
Monocytes Absolute: 0.3 10*3/uL (ref 0.1–1.0)
Monocytes Relative: 6 % (ref 3–12)
NEUTROS ABS: 2.4 10*3/uL (ref 1.7–7.7)
NEUTROS PCT: 43 % (ref 43–77)
Platelets: 355 10*3/uL (ref 150–400)
RBC: 4.84 MIL/uL (ref 3.87–5.11)
RDW: 13 % (ref 11.5–15.5)
WBC: 5.5 10*3/uL (ref 4.0–10.5)

## 2014-11-12 LAB — BASIC METABOLIC PANEL WITH GFR
BUN: 8 mg/dL (ref 6–23)
CHLORIDE: 103 meq/L (ref 96–112)
CO2: 28 mEq/L (ref 19–32)
Calcium: 9 mg/dL (ref 8.4–10.5)
Creat: 0.63 mg/dL (ref 0.50–1.10)
GFR, Est African American: >89 mL/min
GFR, Est Non African American: >89 mL/min
Glucose, Bld: 129 mg/dL — ABNORMAL HIGH (ref 70–99)
Potassium: 3.9 mEq/L (ref 3.5–5.3)
SODIUM: 139 meq/L (ref 135–145)

## 2014-11-12 LAB — HEPATIC FUNCTION PANEL
ALBUMIN: 4.2 g/dL (ref 3.5–5.2)
ALK PHOS: 40 U/L (ref 39–117)
ALT: 15 U/L (ref 0–35)
AST: 15 U/L (ref 0–37)
Bilirubin, Direct: 0.1 mg/dL (ref 0.0–0.3)
Indirect Bilirubin: 0.5 mg/dL (ref 0.2–1.2)
Total Bilirubin: 0.6 mg/dL (ref 0.2–1.2)
Total Protein: 6.7 g/dL (ref 6.0–8.3)

## 2014-11-12 LAB — HEMOGLOBIN A1C
HEMOGLOBIN A1C: 9.6 % — AB (ref <5.7)
MEAN PLASMA GLUCOSE: 229 mg/dL — AB (ref <117)

## 2014-11-12 LAB — TSH: TSH: 0.558 u[IU]/mL (ref 0.350–4.500)

## 2014-11-12 LAB — MAGNESIUM: MAGNESIUM: 1.5 mg/dL (ref 1.5–2.5)

## 2014-11-12 MED ORDER — RANITIDINE HCL 300 MG PO TABS
ORAL_TABLET | ORAL | Status: DC
Start: 2014-11-12 — End: 2024-06-10

## 2014-11-12 NOTE — Progress Notes (Signed)
Assessment and Plan:  Hypertension: Continue medication, monitor blood pressure at home. Continue DASH diet.  Reminder to go to the ER if any CP, SOB, nausea, dizziness, severe HA, changes vision/speech, left arm numbness and tingling and jaw pain. Cholesterol: Continue diet and exercise. Check cholesterol.  Diabetes without complications-Continue diet and exercise. Check A1C Vitamin D Def- check level and continue medications.  Chest pain, atypical normal stress test- get Hpylori, + RUQ tenderness get [place] rule out gallbladder, otherwise will start on PPI samples and will then transition to zantac.  + Impingement syndrome- get cervical neck pillow, exercises given Obesity with co morbidities- long discussion about weight loss, diet, and exercise  Continue diet and meds as discussed. Further disposition pending results of labs. Discussed med's effects and SE's.   OVER 40 minutes of exam, counseling, chart review, referral performed   HPI 44 y.o. female  presents for overdue 3 month follow up  has a past medical history of Diabetes mellitus without complication and Unspecified vitamin D deficiency.  Her blood pressure has been controlled at home, today her BP is BP: 110/78 mmHg.  She does workout. She denies chest pain, shortness of breath, dizziness.  She is on cholesterol medication and denies myalgias. Her cholesterol is at goal. The cholesterol was:  06/17/2014: Cholesterol, Total 130; HDL-C 44; LDL (calc) 65; Triglycerides 105  She has been working on diet and exercise for diabetes without complications, she is not on bASA, she is on ACE/ARB, and denies  paresthesia of the feet, polydipsia, polyuria and visual disturbances. Last A1C was: 06/17/2014: Hemoglobin-A1c 8.2*  Patient is on Vitamin D supplement. 06/17/2014: VITD 24*  With exercise she has left arm numbness and coldness on her right arm.She is a hair stylist.   She has been having a lot of gas/bloating, she will have right chest  pressure, with and without exertion, and have atypical sharp left sided pain with left arm numbness, when she drinks soda she feels better after burping,. She can have nonexertional SOB.she had a normal stress test 12/2013 No NSAID use.  BMI is Body mass index is 29.07 kg/(m^2)., she is working on diet and exercise. Wt Readings from Last 3 Encounters:  11/12/14 159 lb (72.122 kg)  06/17/14 163 lb (73.936 kg)  02/23/14 157 lb (71.215 kg)     Current Medications:  Current Outpatient Prescriptions on File Prior to Visit  Medication Sig Dispense Refill  . atorvastatin (LIPITOR) 40 MG tablet Take 1 tablet (40 mg total) by mouth daily. 30 tablet 3  . glucose monitoring kit (FREESTYLE) monitoring kit 1 each by Does not apply route as needed for other. Please fill strips 1 qd 100 each 5  . lisinopril (PRINIVIL,ZESTRIL) 10 MG tablet TAKE ONE TABLET BY MOUTH ONCE DAILY FOR BLOOD PRESSURE AND KIDNEYS 90 tablet 0  . metFORMIN (GLUCOPHAGE-XR) 500 MG 24 hr tablet TAKE TWO TABLETS BY MOUTH TWICE DAILY FOR DIABETES 120 tablet 3  . mometasone (ELOCON) 0.1 % cream Apply 1 application topically daily. 50 g 2   No current facility-administered medications on file prior to visit.   Medical History:  Past Medical History  Diagnosis Date  . Diabetes mellitus without complication   . Unspecified vitamin D deficiency    Allergies: No Known Allergies   Review of Systems:  Review of Systems  Constitutional: Negative.   Eyes: Negative.   Respiratory: Positive for shortness of breath. Negative for cough, hemoptysis, sputum production and wheezing.   Cardiovascular: Positive for chest pain.  Negative for palpitations, orthopnea, claudication, leg swelling and PND.  Gastrointestinal: Positive for heartburn and abdominal pain. Negative for nausea, vomiting, diarrhea, constipation, blood in stool and melena.  Genitourinary: Negative.   Musculoskeletal: Positive for neck pain. Negative for myalgias, back pain,  joint pain and falls.  Skin: Negative.   Neurological: Positive for tingling and sensory change (arms). Negative for dizziness, tremors, speech change, focal weakness, seizures and loss of consciousness.  Psychiatric/Behavioral: Negative.     Family history- Review and unchanged Social history- Review and unchanged Physical Exam: BP 110/78 mmHg  Pulse 68  Temp(Src) 97.7 F (36.5 C)  Resp 16  Ht 5' 2"  (1.575 m)  Wt 159 lb (72.122 kg)  BMI 29.07 kg/m2 Wt Readings from Last 3 Encounters:  11/12/14 159 lb (72.122 kg)  06/17/14 163 lb (73.936 kg)  02/23/14 157 lb (71.215 kg)   General Appearance: Well nourished, in no apparent distress. Eyes: PERRLA, EOMs, conjunctiva no swelling or erythema Sinuses: No Frontal/maxillary tenderness ENT/Mouth: Ext aud canals clear, TMs without erythema, bulging. No erythema, swelling, or exudate on post pharynx.  Tonsils not swollen or erythematous. Hearing normal.  Neck: Supple, thyroid normal.  Respiratory: Respiratory effort normal, BS equal bilaterally without rales, rhonchi, wheezing or stridor.  Cardio: RRR with no MRGs. Brisk peripheral pulses without edema.  Abdomen: Soft, + BS. Obese, + RUQ and epigastric tenderness negative murphy's, no guarding, rebound, hernias, masses. Lymphatics: Non tender without lymphadenopathy.  Musculoskeletal: Full ROM, 5/5 strength, Normal gait, + impingement sign shoulders.  Skin: Warm, dry without rashes, lesions, ecchymosis.  Neuro: Cranial nerves intact. No cerebellar symptoms.  Psych: Awake and oriented X 3, normal affect, Insight and Judgment appropriate.    Vicie Mutters, PA-C 12:13 PM [facility]

## 2014-11-12 NOTE — Patient Instructions (Signed)
We are starting you on Metformin to prevent or treat diabetes. Metformin does not cause low blood sugars. In order to create energy your cells need insulin and sugar but sometime your cells do not accept the insulin and this can cause increased sugars and decreased energy. The Metformin helps your cells accept insulin and the sugar to give you more energy.   The two most common side effects are nausea and diarrhea, follow these rules to avoid it! You can take imodium per box instructions when starting metformin if needed.   Rules of metformin: 1) start out slow with only one pill daily. Our goal for you is 4 pills a day or 2000mg  total.  2) take with your largest meal. 3) Take with least amount of carbs.   Call if you have any problems.   Diabetes is a very complicated disease...lets simplify it.  An easy way to look at it to understand the complications is if you think of the extra sugar floating in your blood stream as glass shards floating through your blood stream.    Diabetes affects your small vessels first: 1) The glass shards (sugar) scraps down the tiny blood vessels in your eyes and lead to diabetic retinopathy, the leading cause of blindness in the [place]. Diabetes is the leading cause of newly diagnosed adult (47 to 43 years of age) blindness in the [place].  2) The glass shards scratches down the tiny vessels of your legs leading to nerve damage called neuropathy and can lead to amputations of your feet. More than 60% of all non-traumatic amputations of lower limbs occur in people with diabetes.  3) Over time the small vessels in your brain are shredded and closed off, individually this does not cause any problems but over a long period of time many of the small vessels being blocked can lead to Vascular Dementia.   4) Your kidney's are a filter system and have a "net" that keeps certain things in the body and lets bad things out. Sugar shreds this net and leads to kidney damage  and eventually failure. Decreasing the sugar that is destroying the net and certain blood pressure medications can help stop or decrease progression of kidney disease. Diabetes was the primary cause of kidney failure in 44 percent of all new cases in 2011.  5) Diabetes also destroys the small vessels in your penis that lead to erectile dysfunction. Eventually the vessels are so damaged that you may not be responsive to cialis or viagra.   Diabetes and your large vessels: Your larger vessels consist of your coronary arteries in your heart and the carotid vessels to your brain. Diabetes or even increased sugars put you at 300% increased risk of heart attack and stroke and this is why.. The sugar scrapes down your large blood vessels and your body sees this as an internal injury and tries to repair itself. Just like you get a scab on your skin, your platelets will stick to the blood vessel wall trying to heal it. This is why we have diabetics on low dose aspirin daily, this prevents the platelets from sticking and can prevent plaque formation. In addition, your body takes cholesterol and tries to shove it into the open wound. This is why we want your LDL, or bad cholesterol, below 70.   The combination of platelets and cholesterol over 5-10 years forms plaque that can break off and cause a heart attack or stroke.   PLEASE REMEMBER:  Diabetes is  preventable! Up to 85 percent of complications and morbidities among individuals with type 2 diabetes can be prevented, delayed, or effectively treated and minimized with regular visits to a health professional, appropriate monitoring and medication, and a healthy diet and lifestyle.  .   Bad carbs also include fruit juice, alcohol, and sweet tea. These are empty calories that do not signal to your brain that you are full.   Please remember the good carbs are still carbs which convert into sugar. So please measure them out no more than 1/2-1 cup of rice,  oatmeal, pasta, and beans  Veggies are however free foods! Pile them on.   Not all fruit is created equal. Please see the list below, the fruit at the bottom is higher in sugars than the fruit at the top. Please avoid all dried fruits.     Before you even begin to attack a weight-loss plan, it pays to remember this: You are not fat. You have fat. Losing weight isn't about blame or shame; it's simply another achievement to accomplish. Dieting is like any other skill-you have to buckle down and work at it. As long as you act in a smart, reasonable way, you'll ultimately get where you want to be. Here are some weight loss pearls for you.  1. It's Not a Diet. It's a Lifestyle Thinking of a diet as something you're on and suffering through only for the short term doesn't work. To shed weight and keep it off, you need to make permanent changes to the way you eat. It's OK to indulge occasionally, of course, but if you cut calories temporarily and then revert to your old way of eating, you'll gain back the weight quicker than you can say yo-yo. Use it to lose it. Research shows that one of the best predictors of long-term weight loss is how many pounds you drop in the first month. For that reason, nutritionists often suggest being stricter for the first two weeks of your new eating strategy to build momentum. Cut out added sugar and alcohol and avoid unrefined carbs. After that, figure out how you can reincorporate them in a way that's healthy and maintainable.  2. There's a Right Way to Exercise Working out burns calories and fat and boosts your metabolism by building muscle. But those trying to lose weight are notorious for overestimating the number of calories they burn and underestimating the amount they take in. Unfortunately, your system is biologically programmed to hold on to extra pounds and that means when you start exercising, your body senses the deficit and ramps up its hunger signals. If you're not  diligent, you'll eat everything you burn and then some. Use it to lose it. Cardio gets all the exercise glory, but strength and interval training are the real heroes. They help you build lean muscle, which in turn increases your metabolism and calorie-burning ability 3. Don't Overreact to Mild Hunger Some people have a hard time losing weight because of hunger anxiety. To them, being hungry is bad-something to be avoided at all costs-so they carry snacks with them and eat when they don't need to. Others eat because they're stressed out or bored. While you never want to get to the point of being ravenous (that's when bingeing is likely to happen), a hunger pang, a craving, or the fact that it's 3:00 p.m. should not send you racing for the vending machine or obsessing about the energy bar in your purse. Ideally, you should put off eating  until your stomach is growling and it's difficult to concentrate.  Use it to lose it. When you feel the urge to eat, use the HALT method. Ask yourself, Am I really hungry? Or am I angry or anxious, lonely or bored, or tired? If you're still not certain, try the apple test. If you're truly hungry, an apple should seem delicious; if it doesn't, something else is going on. Or you can try drinking water and making yourself busy, if you are still hungry try a healthy snack.  4. Not All Calories Are Created Equal The mechanics of weight loss are pretty simple: Take in fewer calories than you use for energy. But the kind of food you eat makes all the difference. Processed food that's high in saturated fat and refined starch or sugar can cause inflammation that disrupts the hormone signals that tell your brain you're full. The result: You eat a lot more.  Use it to lose it. Clean up your diet. Swap in whole, unprocessed foods, including vegetables, lean protein, and healthy fats that will fill you up and give you the biggest nutritional bang for your calorie buck. In a few weeks, as  your brain starts receiving regular hunger and fullness signals once again, you'll notice that you feel less hungry overall and naturally start cutting back on the amount you eat.  5. Protein, Produce, and Plant-Based Fats Are Your Weight-Loss Trinity Here's why eating the three Ps regularly will help you drop pounds. Protein fills you up. You need it to build lean muscle, which keeps your metabolism humming so that you can torch more fat. People in a weight-loss program who ate double the recommended daily allowance for protein (about 110 grams for a 150-pound woman) lost 70 percent of their weight from fat, while people who ate the RDA lost only about 40 percent, one study found. Produce is packed with filling fiber. "It's very difficult to consume too many calories if you're eating a lot of vegetables. Example: Three cups of broccoli is a lot of food, yet only 93 calories. (Fruit is another story. It can be easy to overeat and can contain a lot of calories from sugar, so be sure to monitor your intake.) Plant-based fats like olive oil and those in avocados and nuts are healthy and extra satiating.  Use it to lose it. Aim to incorporate each of the three Ps into every meal and snack. People who eat protein throughout the day are able to keep weight off, according to a study in the American Journal of Clinical Nutrition. In addition to meat, poultry and seafood, good sources are beans, lentils, eggs, tofu, and yogurt. As for fat, keep portion sizes in check by measuring out salad dressing, oil, and nut butters (shoot for one to two tablespoons). Finally, eat veggies or a little fruit at every meal. People who did that consumed 308 fewer calories but didn't feel any hungrier than when they didn't eat more produce.  7. How You Eat Is As Important As What You Eat In order for your brain to register that you're full, you need to focus on what you're eating. Sit down whenever you eat, preferably at a table.  Turn off the TV or computer, put down your phone, and look at your food. Smell it. Chew slowly, and don't put another bite on your fork until you swallow. When women ate lunch this attentively, they consumed 30 percent less when snacking later than those who listened to an [employer] Beyond[employer]obook  at lunchtime, according to a study in the [place] Journal of Nutrition. 8. Weighing Yourself Really Works The scale provides the best evidence about whether your efforts are paying off. Seeing the numbers tick up or down or stagnate is motivation to keep going-or to rethink your approach. A 2015 study at [facility] found that daily weigh-ins helped people lose more weight, keep it off, and maintain that loss, even after two years. Use it to lose it. Step on the scale at the same time every day for the best results. If your weight shoots up several pounds from one weigh-in to the next, don't freak out. Eating a lot of salt the night before or having your period is the likely culprit. The number should return to normal in a day or two. It's a steady climb that you need to do something about. 9. Too Much Stress and Too Little Sleep Are Your Enemies When you're tired and frazzled, your body cranks up the production of cortisol, the stress hormone that can cause carb cravings. Not getting enough sleep also boosts your levels of ghrelin, a hormone associated with hunger, while suppressing leptin, a hormone that signals fullness and satiety. People on a diet who slept only five and a half hours a night for two weeks lost 55 percent less fat and were hungrier than those who slept eight and a half hours, according to a study in the [place] [employer] Journal. Use it to lose it. Prioritize sleep, aiming for seven hours or more a night, which research shows helps lower stress. And make sure you're getting quality zzz's. If a snoring spouse or a fidgety cat wakes you up frequently throughout the night, you may end up getting  the equivalent of just four hours of sleep, according to a study from [facility]. Keep pets out of the bedroom, and use a white-noise app to drown out snoring. 10. You Will Hit a plateau-And You Can Bust Through It As you slim down, your body releases much less leptin, the fullness hormone.  If you're not strength training, start right now. Building muscle can raise your metabolism to help you overcome a plateau. To keep your body challenged and burning calories, incorporate new moves and more intense intervals into your workouts or add another sweat session to your weekly routine. Alternatively, cut an extra 100 calories or so a day from your diet. Now that you've lost weight, your body simply doesn't need as much fuel.   Ways to cut 100 calories  1. Eat your eggs with hot sauce OR salsa instead of cheese.  Eggs are great for breakfast, but many people consider eggs and cheese to be BFFs. Instead of cheese-1 oz. of cheddar has 114 calories-top your eggs with hot sauce, which contains no calories and helps with satiety and metabolism. Salsa is also a great option!!  2. Top your toast, waffles or pancakes with mashed berries instead of jelly or syrup. Half a cup of berries-fresh, frozen or thawed-has about 40 calories, compared with 2 tbsp. of maple syrup or jelly, which both have about 100 calories. The berries will also give you a good punch of fiber, which helps keep you full and satisfied and won't spike blood sugar quickly like the jelly or syrup. 3. Swap the non-fat latte for black coffee with a splash of half-and-half. Contrary to its name, that non-fat latte has 130 calories and a startling 19g of carbohydrates per 16 oz. serving. Replacing that 'light' drinkable  dessert with a black coffee with a splash of half-and-half saves you more than 100 calories per 16 oz. serving. 4. Sprinkle salads with freeze-dried raspberries instead of dried cranberries. If you want a sweet addition to your  nutritious salad, stay away from dried cranberries. They have a whopping 130 calories per  cup and 30g carbohydrates. Instead, sprinkle freeze-dried raspberries guilt-free and save more than 100 calories per  cup serving, adding 3g of belly-filling fiber. 5. Go for mustard in place of mayo on your sandwich. Mustard can add really nice flavor to any sandwich, and there are tons of varieties, from spicy to honey. A serving of mayo is 95 calories, versus 10 calories in a serving of mustard. 6. Choose a DIY salad dressing instead of the store-bought kind. Mix Dijon or whole grain mustard with low-fat Kefir or red wine vinegar and garlic. 7. Use hummus as a spread instead of a dip. Use hummus as a spread on a high-fiber cracker or tortilla with a sandwich and save on calories without sacrificing taste. 8. Pick just one salad "accessory." Salad isn't automatically a calorie winner. It's easy to over-accessorize with toppings. Instead of topping your salad with nuts, avocado and cranberries (all three will clock in at 313 calories), just pick one. The next day, choose a different accessory, which will also keep your salad interesting. You don't wear all your jewelry every day, right? 9. Ditch the white pasta in favor of spaghetti squash. One cup of cooked spaghetti squash has about 40 calories, compared with traditional spaghetti, which comes with more than 200. Spaghetti squash is also nutrient-dense. It's a good source of fiber and Vitamins A and C, and it can be eaten just like you would eat pasta-with a great tomato sauce and [place] meatballs or with pesto, tofu and spinach, for example. 10. Dress up your chili, soups and stews with non-fat [place] yogurt instead of sour cream. Just a 'dollop' of sour cream can set you back 115 calories and a whopping 12g of fat-seven of which are of the artery-clogging variety. Added bonus: [place] yogurt is packed with muscle-building protein, calcium and B Vitamins. 11.  Mash cauliflower instead of mashed potatoes. One cup of traditional mashed potatoes-in all their creamy goodness-has more than 200 calories, compared to mashed cauliflower, which you can typically eat for less than 100 calories per 1 cup serving. Cauliflower is a great source of the antioxidant indole-3-carbinol (I3C), which may help reduce the risk of some cancers, like breast cancer. 12. Ditch the ice cream sundae in favor of a [place] yogurt parfait. Instead of a cup of ice cream or fro-yo for dessert, try 1 cup of nonfat Greek yogurt topped with fresh berries and a sprinkle of cacao nibs. Both toppings are packed with antioxidants, which can help reduce cellular inflammation and oxidative damage. And the comparison is a no-brainer: One cup of ice cream has about 275 calories; one cup of frozen yogurt has about 230; and a cup of Greek yogurt has just 130, plus twice the protein, so you're less likely to return to the freezer for a second helping. 13. Put olive oil in a spray container instead of using it directly from the bottle. Each tablespoon of olive oil is 120 calories and 15g of fat. Use a mister instead of pouring it straight into the pan or onto a salad. This allows for portion control and will save you more than 100 calories. 14. When baking, substitute canned pumpkin for butter or  oil. Canned pumpkin-not pumpkin pie mix-is loaded with Vitamin A, which is important for skin and eye health, as well as immunity. And the comparisons are pretty crazy:  cup of canned pumpkin has about 40 calories, compared to butter or oil, which has more than 800 calories. Yes, 800 calories. Applesauce and mashed banana can also serve as good substitutions for butter or oil, usually in a 1:1 ratio. 15. Top casseroles with high-fiber cereal instead of breadcrumbs. Breadcrumbs are typically made with white bread, while breakfast cereals contain 5-9g of fiber per serving. Not only will you save more than 150 calories  per  cup serving, the swap will also keep you more full and you'll get a metabolism boost from the added fiber. 16. Snack on pistachios instead of macadamia nuts. Believe it or not, you get the same amount of calories from 35 pistachios (100 calories) as you would from only five macadamia nuts. 17. Chow down on kale chips rather than potato chips. This is my favorite 'don't knock it 'till you try it' swap. Kale chips are so easy to make at home, and you can spice them up with a little grated parmesan or chili powder. Plus, they're a mere fraction of the calories of potato chips, but with the same crunch factor we crave so often. 18. Add seltzer and some fruit slices to your cocktail instead of soda or fruit juice. One cup of soda or fruit juice can pack on as much as 140 calories. Instead, use seltzer and fruit slices. The fruit provides valuable phytochemicals, such as flavonoids and anthocyanins, which help to combat cancer and stave off the aging process.

## 2014-11-13 LAB — VITAMIN D 25 HYDROXY (VIT D DEFICIENCY, FRACTURES): VIT D 25 HYDROXY: 21 ng/mL — AB (ref 30–100)

## 2014-11-17 LAB — H. PYLORI BREATH TEST: H. pylori Breath Test: NOT DETECTED

## 2014-11-26 ENCOUNTER — Ambulatory Visit
Admission: RE | Admit: 2014-11-26 | Discharge: 2014-11-26 | Disposition: A | Discharge status: Home / Self Care | Payer: BLUE CROSS/BLUE SHIELD | Source: Ambulatory Visit | Attending: Physician Assistant | Admitting: Physician Assistant

## 2014-11-26 DIAGNOSIS — R1011 Right upper quadrant pain: Secondary | ICD-10-CM

## 2015-02-15 ENCOUNTER — Ambulatory Visit (INDEPENDENT_AMBULATORY_CARE_PROVIDER_SITE_OTHER): Payer: BLUE CROSS/BLUE SHIELD | Admitting: Physician Assistant

## 2015-02-15 ENCOUNTER — Encounter: Payer: Self-pay | Admitting: Physician Assistant

## 2015-02-15 VITALS — BP 120/72 | HR 76 | Temp 98.2°F | Resp 18 | Ht 62.0 in | Wt 154.0 lb

## 2015-02-15 DIAGNOSIS — K7581 Nonalcoholic steatohepatitis (NASH): Secondary | ICD-10-CM | POA: Insufficient documentation

## 2015-02-15 DIAGNOSIS — I10 Essential (primary) hypertension: Secondary | ICD-10-CM

## 2015-02-15 DIAGNOSIS — R350 Frequency of micturition: Secondary | ICD-10-CM

## 2015-02-15 DIAGNOSIS — E559 Vitamin D deficiency, unspecified: Secondary | ICD-10-CM

## 2015-02-15 DIAGNOSIS — Z79899 Other long term (current) drug therapy: Secondary | ICD-10-CM

## 2015-02-15 DIAGNOSIS — E119 Type 2 diabetes mellitus without complications: Secondary | ICD-10-CM

## 2015-02-15 DIAGNOSIS — E785 Hyperlipidemia, unspecified: Secondary | ICD-10-CM

## 2015-02-15 LAB — CBC WITH DIFFERENTIAL/PLATELET
Basophils Absolute: 0 10*3/uL (ref 0.0–0.1)
Basophils Relative: 0 % (ref 0–1)
EOS ABS: 0.1 10*3/uL (ref 0.0–0.7)
Eosinophils Relative: 1 % (ref 0–5)
HCT: 39.8 % (ref 36.0–46.0)
HEMOGLOBIN: 13.5 g/dL (ref 12.0–15.0)
LYMPHS PCT: 46 % (ref 12–46)
Lymphs Abs: 2.7 10*3/uL (ref 0.7–4.0)
MCH: 26.1 pg (ref 26.0–34.0)
MCHC: 33.9 g/dL (ref 30.0–36.0)
MCV: 77 fL — ABNORMAL LOW (ref 78.0–100.0)
MPV: 8.9 fL (ref 8.6–12.4)
Monocytes Absolute: 0.3 10*3/uL (ref 0.1–1.0)
Monocytes Relative: 6 % (ref 3–12)
Neutro Abs: 2.7 10*3/uL (ref 1.7–7.7)
Neutrophils Relative %: 47 % (ref 43–77)
Platelets: 327 10*3/uL (ref 150–400)
RBC: 5.17 MIL/uL — ABNORMAL HIGH (ref 3.87–5.11)
RDW: 12.7 % (ref 11.5–15.5)
WBC: 5.8 10*3/uL (ref 4.0–10.5)

## 2015-02-15 LAB — HEMOGLOBIN A1C
Hgb A1c MFr Bld: 11.4 % — ABNORMAL HIGH (ref <5.7)
Mean Plasma Glucose: 280 mg/dL — ABNORMAL HIGH (ref <117)

## 2015-02-15 MED ORDER — MOMETASONE FUROATE 0.1 % EX CREA: 1.0000 "application " | TOPICAL_CREAM | Freq: Every day | CUTANEOUS | Status: DC

## 2015-02-15 MED ORDER — METFORMIN HCL ER 500 MG PO TB24: ORAL_TABLET | ORAL | Status: DC

## 2015-02-15 MED ORDER — LISINOPRIL 10 MG PO TABS: ORAL_TABLET | ORAL | Status: DC

## 2015-02-15 MED ORDER — ATORVASTATIN CALCIUM 40 MG PO TABS: 40.0000 mg | ORAL_TABLET | Freq: Every day | ORAL | Status: DC

## 2015-02-15 NOTE — Progress Notes (Addendum)
Assessment and Plan:  Hypertension: Continue medication, monitor blood pressure at home. Continue DASH diet.  Reminder to go to the ER if any CP, SOB, nausea, dizziness, severe HA, changes vision/speech, left arm numbness and tingling and jaw pain. Cholesterol: Continue diet and exercise. Check cholesterol.  Diabetes without complications-Continue diet and exercise. Check A1C, may get on invokana depending on this A1C Vitamin D Def- check level and continue medications.  Polyuria- ? From sugars versus infection- will get urine culture  Continue diet and meds as discussed. Further disposition pending results of labs. Discussed med's effects and SE's.     HPI 44 y.o. female  presents for overdue 3 month follow up  has a past medical history of Diabetes mellitus without complication and Unspecified vitamin D deficiency.  Her blood pressure has been controlled at home, today her BP is BP: 120/72 mmHg.  She does workout. She denies chest pain, shortness of breath, dizziness.  She is on cholesterol medication and denies myalgias. Her cholesterol is at goal. The cholesterol was:  11/12/2014: Cholesterol 122; HDL 47; LDL Cholesterol 60; Triglycerides 76  She has been working on diet and exercise for diabetes without complications, she is not on bASA, she is on ACE/ARB, she is on metformin 2000 IU daily but admits to not taking 4 daily and denies  paresthesia of the feet, polydipsia, polyuria and visual disturbances. Last A1C was: 11/12/2014: Hgb A1c MFr Bld 9.6*  Patient is on Vitamin D supplement. 11/12/2014: Vit D, 25-Hydroxy 21*  BMI is Body mass index is 28.16 kg/(m^2)., she is working on diet and exercise. Wt Readings from Last 3 Encounters:  02/15/15 154 lb (69.854 kg)  11/12/14 159 lb (72.122 kg)  06/17/14 163 lb (73.936 kg)     Current Medications:  Current Outpatient Prescriptions on File Prior to Visit  Medication Sig Dispense Refill  . atorvastatin (LIPITOR) 40 MG tablet Take 1 tablet  (40 mg total) by mouth daily. 30 tablet 3  . glucose monitoring kit (FREESTYLE) monitoring kit 1 each by Does not apply route as needed for other. Please fill strips 1 qd 100 each 5  . lisinopril (PRINIVIL,ZESTRIL) 10 MG tablet TAKE ONE TABLET BY MOUTH ONCE DAILY FOR BLOOD PRESSURE AND KIDNEYS 90 tablet 0  . metFORMIN (GLUCOPHAGE-XR) 500 MG 24 hr tablet TAKE TWO TABLETS BY MOUTH TWICE DAILY FOR DIABETES 120 tablet 3  . mometasone (ELOCON) 0.1 % cream Apply 1 application topically daily. 50 g 2  . ranitidine (ZANTAC) 300 MG tablet Take twice a day while trying to get off PPI, then can go to once at night 60 tablet 3   No current facility-administered medications on file prior to visit.   Medical History:  Past Medical History  Diagnosis Date  . Diabetes mellitus without complication   . Unspecified vitamin D deficiency    Allergies: No Known Allergies   Review of Systems:  Review of Systems  Constitutional: Negative.   Eyes: Negative.   Respiratory: Negative for cough, hemoptysis, sputum production, shortness of breath and wheezing.   Cardiovascular: Negative for chest pain, palpitations, orthopnea, claudication, leg swelling and PND.  Gastrointestinal: Negative for heartburn, nausea, vomiting, abdominal pain, diarrhea, constipation, blood in stool and melena.  Genitourinary: Positive for frequency (states it smells sweet). Negative for dysuria, urgency, hematuria and flank pain.  Musculoskeletal: Positive for neck pain. Negative for myalgias, back pain, joint pain and falls.  Skin: Negative.   Neurological: Positive for tingling and sensory change (arms). Negative for dizziness,  tremors, speech change, focal weakness, seizures and loss of consciousness.  Endo/Heme/Allergies: Positive for polydipsia.  Psychiatric/Behavioral: Negative.     Family history- Review and unchanged Social history- Review and unchanged Physical Exam: BP 120/72 mmHg  Pulse 76  Temp(Src) 98.2 F (36.8 C)  (Temporal)  Resp 18  Ht 5' 2"  (1.575 m)  Wt 154 lb (69.854 kg)  BMI 28.16 kg/m2  LMP 02/01/2015 Wt Readings from Last 3 Encounters:  02/15/15 154 lb (69.854 kg)  11/12/14 159 lb (72.122 kg)  06/17/14 163 lb (73.936 kg)   General Appearance: Well nourished, in no apparent distress. Eyes: PERRLA, EOMs, conjunctiva no swelling or erythema Sinuses: No Frontal/maxillary tenderness ENT/Mouth: Ext aud canals clear, TMs without erythema, bulging. No erythema, swelling, or exudate on post pharynx.  Tonsils not swollen or erythematous. Hearing normal.  Neck: Supple, thyroid normal.  Respiratory: Respiratory effort normal, BS equal bilaterally without rales, rhonchi, wheezing or stridor.  Cardio: RRR with no MRGs. Brisk peripheral pulses without edema.  Abdomen: Soft, + BS. Obese, + RUQ and epigastric tenderness negative murphy's, no guarding, rebound, hernias, masses. Lymphatics: Non tender without lymphadenopathy.  Musculoskeletal: Full ROM, 5/5 strength, Normal gait, + impingement sign shoulders.  Skin: Warm, dry without rashes, lesions, ecchymosis.  Neuro: Cranial nerves intact. No cerebellar symptoms.  Psych: Awake and oriented X 3, normal affect, Insight and Judgment appropriate.    Vicie Mutters, PA-C 11:46 AM [facility]

## 2015-02-15 NOTE — Patient Instructions (Addendum)
Add 5000 IU vitamin D  Vitamin D goal is between 60-80  Please make sure that you are taking your Vitamin D as directed.   It is very important as a natural anti-inflammatory   helping hair, skin, and nails, as well as reducing stroke and heart attack risk.   It helps your bones and helps with mood.  It also decreases numerous cancer risks so please take it as directed.   Low Vit D is associated with a 200-300% higher risk for CANCER   and 200-300% higher risk for HEART   ATTACK  &  STROKE.    .....................................Marland Kitchen  It is also associated with higher death rate at younger ages,   autoimmune diseases like Rheumatoid arthritis, Lupus, Multiple Sclerosis.     Also many other serious conditions, like depression, Alzheimer's  Dementia, infertility, muscle aches, fatigue, fibromyalgia - just to name a few.  +++++++++++++++++++  We are starting you on Metformin to prevent or treat diabetes. Metformin does not cause low blood sugars. In order to create energy your cells need insulin and sugar but sometime your cells do not accept the insulin and this can cause increased sugars and decreased energy. The Metformin helps your cells accept insulin and the sugar to give you more energy.   The two most common side effects are nausea and diarrhea, follow these rules to avoid it! You can take imodium per box instructions when starting metformin if needed.   Rules of metformin: 1) start out slow with only one pill daily. Our goal for you is 4 pills a day or 2000mg  total.  2) take with your largest meal. 3) Take with least amount of carbs.   Call if you have any problems.   Diabetes is a very complicated disease...lets simplify it.  An easy way to look at it to understand the complications is if you think of the extra sugar floating in your blood stream as glass shards floating through your blood stream.    Diabetes affects your small vessels first: 1) The glass shards  (sugar) scraps down the tiny blood vessels in your eyes and lead to diabetic retinopathy, the leading cause of blindness in the [place]. Diabetes is the leading cause of newly diagnosed adult (23 to 43 years of age) blindness in the [place].  2) The glass shards scratches down the tiny vessels of your legs leading to nerve damage called neuropathy and can lead to amputations of your feet. More than 60% of all non-traumatic amputations of lower limbs occur in people with diabetes.  3) Over time the small vessels in your brain are shredded and closed off, individually this does not cause any problems but over a long period of time many of the small vessels being blocked can lead to Vascular Dementia.   4) Your kidney's are a filter system and have a "net" that keeps certain things in the body and lets bad things out. Sugar shreds this net and leads to kidney damage and eventually failure. Decreasing the sugar that is destroying the net and certain blood pressure medications can help stop or decrease progression of kidney disease. Diabetes was the primary cause of kidney failure in 44 percent of all new cases in 2011.  5) Diabetes also destroys the small vessels in your penis that lead to erectile dysfunction. Eventually the vessels are so damaged that you may not be responsive to cialis or viagra.   Diabetes and your large vessels: Your larger vessels consist of  your coronary arteries in your heart and the carotid vessels to your brain. Diabetes or even increased sugars put you at 300% increased risk of heart attack and stroke and this is why.. The sugar scrapes down your large blood vessels and your body sees this as an internal injury and tries to repair itself. Just like you get a scab on your skin, your platelets will stick to the blood vessel wall trying to heal it. This is why we have diabetics on low dose aspirin daily, this prevents the platelets from sticking and can prevent plaque formation. In  addition, your body takes cholesterol and tries to shove it into the open wound. This is why we want your LDL, or bad cholesterol, below 70.   The combination of platelets and cholesterol over 5-10 years forms plaque that can break off and cause a heart attack or stroke.   PLEASE REMEMBER:  Diabetes is preventable! Up to 85 percent of complications and morbidities among individuals with type 2 diabetes can be prevented, delayed, or effectively treated and minimized with regular visits to a health professional, appropriate monitoring and medication, and a healthy diet and lifestyle.  Before you even begin to attack a weight-loss plan, it pays to remember this: You are not fat. You have fat. Losing weight isn't about blame or shame; it's simply another achievement to accomplish. Dieting is like any other skill-you have to buckle down and work at it. As long as you act in a smart, reasonable way, you'll ultimately get where you want to be. Here are some weight loss pearls for you.  1. It's Not a Diet. It's a Lifestyle Thinking of a diet as something you're on and suffering through only for the short term doesn't work. To shed weight and keep it off, you need to make permanent changes to the way you eat. It's OK to indulge occasionally, of course, but if you cut calories temporarily and then revert to your old way of eating, you'll gain back the weight quicker than you can say yo-yo. Use it to lose it. Research shows that one of the best predictors of long-term weight loss is how many pounds you drop in the first month. For that reason, nutritionists often suggest being stricter for the first two weeks of your new eating strategy to build momentum. Cut out added sugar and alcohol and avoid unrefined carbs. After that, figure out how you can reincorporate them in a way that's healthy and maintainable.  2. There's a Right Way to Exercise Working out burns calories and fat and boosts your metabolism by building  muscle. But those trying to lose weight are notorious for overestimating the number of calories they burn and underestimating the amount they take in. Unfortunately, your system is biologically programmed to hold on to extra pounds and that means when you start exercising, your body senses the deficit and ramps up its hunger signals. If you're not diligent, you'll eat everything you burn and then some. Use it to lose it. Cardio gets all the exercise glory, but strength and interval training are the real heroes. They help you build lean muscle, which in turn increases your metabolism and calorie-burning ability 3. Don't Overreact to Mild Hunger Some people have a hard time losing weight because of hunger anxiety. To them, being hungry is bad-something to be avoided at all costs-so they carry snacks with them and eat when they don't need to. Others eat because they're stressed out or bored. While you  never want to get to the point of being ravenous (that's when bingeing is likely to happen), a hunger pang, a craving, or the fact that it's 3:00 p.m. should not send you racing for the vending machine or obsessing about the energy bar in your purse. Ideally, you should put off eating until your stomach is growling and it's difficult to concentrate.  Use it to lose it. When you feel the urge to eat, use the HALT method. Ask yourself, Am I really hungry? Or am I angry or anxious, lonely or bored, or tired? If you're still not certain, try the apple test. If you're truly hungry, an apple should seem delicious; if it doesn't, something else is going on. Or you can try drinking water and making yourself busy, if you are still hungry try a healthy snack.  4. Not All Calories Are Created Equal The mechanics of weight loss are pretty simple: Take in fewer calories than you use for energy. But the kind of food you eat makes all the difference. Processed food that's high in saturated fat and refined starch or sugar can cause  inflammation that disrupts the hormone signals that tell your brain you're full. The result: You eat a lot more.  Use it to lose it. Clean up your diet. Swap in whole, unprocessed foods, including vegetables, lean protein, and healthy fats that will fill you up and give you the biggest nutritional bang for your calorie buck. In a few weeks, as your brain starts receiving regular hunger and fullness signals once again, you'll notice that you feel less hungry overall and naturally start cutting back on the amount you eat.  5. Protein, Produce, and Plant-Based Fats Are Your Weight-Loss Trinity Here's why eating the three Ps regularly will help you drop pounds. Protein fills you up. You need it to build lean muscle, which keeps your metabolism humming so that you can torch more fat. People in a weight-loss program who ate double the recommended daily allowance for protein (about 110 grams for a 150-pound woman) lost 70 percent of their weight from fat, while people who ate the RDA lost only about 40 percent, one study found. Produce is packed with filling fiber. "It's very difficult to consume too many calories if you're eating a lot of vegetables. Example: Three cups of broccoli is a lot of food, yet only 93 calories. (Fruit is another story. It can be easy to overeat and can contain a lot of calories from sugar, so be sure to monitor your intake.) Plant-based fats like olive oil and those in avocados and nuts are healthy and extra satiating.  Use it to lose it. Aim to incorporate each of the three Ps into every meal and snack. People who eat protein throughout the day are able to keep weight off, according to a study in the American Journal of Clinical Nutrition. In addition to meat, poultry and seafood, good sources are beans, lentils, eggs, tofu, and yogurt. As for fat, keep portion sizes in check by measuring out salad dressing, oil, and nut butters (shoot for one to two tablespoons). Finally, eat veggies  or a little fruit at every meal. People who did that consumed 308 fewer calories but didn't feel any hungrier than when they didn't eat more produce.  7. How You Eat Is As Important As What You Eat In order for your brain to register that you're full, you need to focus on what you're eating. Sit down whenever you eat, preferably at  a table. Turn off the TV or computer, put down your phone, and look at your food. Smell it. Chew slowly, and don't put another bite on your fork until you swallow. When women ate lunch this attentively, they consumed 30 percent less when snacking later than those who listened to an audiobook at lunchtime, according to a study in the [place] Journal of Nutrition. 8. Weighing Yourself Really Works The scale provides the best evidence about whether your efforts are paying off. Seeing the numbers tick up or down or stagnate is motivation to keep going-or to rethink your approach. A 2015 study at [facility] found that daily weigh-ins helped people lose more weight, keep it off, and maintain that loss, even after two years. Use it to lose it. Step on the scale at the same time every day for the best results. If your weight shoots up several pounds from one weigh-in to the next, don't freak out. Eating a lot of salt the night before or having your period is the likely culprit. The number should return to normal in a day or two. It's a steady climb that you need to do something about. 9. Too Much Stress and Too Little Sleep Are Your Enemies When you're tired and frazzled, your body cranks up the production of cortisol, the stress hormone that can cause carb cravings. Not getting enough sleep also boosts your levels of ghrelin, a hormone associated with hunger, while suppressing leptin, a hormone that signals fullness and satiety. People on a diet who slept only five and a half hours a night for two weeks lost 55 percent less fat and were hungrier than those who slept eight and a  half hours, according to a study in the [place] [employer] Journal. Use it to lose it. Prioritize sleep, aiming for seven hours or more a night, which research shows helps lower stress. And make sure you're getting quality zzz's. If a snoring spouse or a fidgety cat wakes you up frequently throughout the night, you may end up getting the equivalent of just four hours of sleep, according to a study from [facility]. Keep pets out of the bedroom, and use a white-noise app to drown out snoring. 10. You Will Hit a plateau-And You Can Bust Through It As you slim down, your body releases much less leptin, the fullness hormone.  If you're not strength training, start right now. Building muscle can raise your metabolism to help you overcome a plateau. To keep your body challenged and burning calories, incorporate new moves and more intense intervals into your workouts or add another sweat session to your weekly routine. Alternatively, cut an extra 100 calories or so a day from your diet. Now that you've lost weight, your body simply doesn't need as much fuel.   Ways to cut 100 calories  1. Eat your eggs with hot sauce OR salsa instead of cheese.  Eggs are great for breakfast, but many people consider eggs and cheese to be BFFs. Instead of cheese-1 oz. of cheddar has 114 calories-top your eggs with hot sauce, which contains no calories and helps with satiety and metabolism. Salsa is also a great option!!  2. Top your toast, waffles or pancakes with mashed berries instead of jelly or syrup. Half a cup of berries-fresh, frozen or thawed-has about 40 calories, compared with 2 tbsp. of maple syrup or jelly, which both have about 100 calories. The berries will also give you a good punch of fiber, which helps  keep you full and satisfied and won't spike blood sugar quickly like the jelly or syrup. 3. Swap the non-fat latte for black coffee with a splash of half-and-half. Contrary to its name, that  non-fat latte has 130 calories and a startling 19g of carbohydrates per 16 oz. serving. Replacing that 'light' drinkable dessert with a black coffee with a splash of half-and-half saves you more than 100 calories per 16 oz. serving. 4. Sprinkle salads with freeze-dried raspberries instead of dried cranberries. If you want a sweet addition to your nutritious salad, stay away from dried cranberries. They have a whopping 130 calories per  cup and 30g carbohydrates. Instead, sprinkle freeze-dried raspberries guilt-free and save more than 100 calories per  cup serving, adding 3g of belly-filling fiber. 5. Go for mustard in place of mayo on your sandwich. Mustard can add really nice flavor to any sandwich, and there are tons of varieties, from spicy to honey. A serving of mayo is 95 calories, versus 10 calories in a serving of mustard. 6. Choose a DIY salad dressing instead of the store-bought kind. Mix Dijon or whole grain mustard with low-fat Kefir or red wine vinegar and garlic. 7. Use hummus as a spread instead of a dip. Use hummus as a spread on a high-fiber cracker or tortilla with a sandwich and save on calories without sacrificing taste. 8. Pick just one salad "accessory." Salad isn't automatically a calorie winner. It's easy to over-accessorize with toppings. Instead of topping your salad with nuts, avocado and cranberries (all three will clock in at 313 calories), just pick one. The next day, choose a different accessory, which will also keep your salad interesting. You don't wear all your jewelry every day, right? 9. Ditch the white pasta in favor of spaghetti squash. One cup of cooked spaghetti squash has about 40 calories, compared with traditional spaghetti, which comes with more than 200. Spaghetti squash is also nutrient-dense. It's a good source of fiber and Vitamins A and C, and it can be eaten just like you would eat pasta-with a great tomato sauce and [place] meatballs or with pesto, tofu  and spinach, for example. 10. Dress up your chili, soups and stews with non-fat [place] yogurt instead of sour cream. Just a 'dollop' of sour cream can set you back 115 calories and a whopping 12g of fat-seven of which are of the artery-clogging variety. Added bonus: [place] yogurt is packed with muscle-building protein, calcium and B Vitamins. 11. Mash cauliflower instead of mashed potatoes. One cup of traditional mashed potatoes-in all their creamy goodness-has more than 200 calories, compared to mashed cauliflower, which you can typically eat for less than 100 calories per 1 cup serving. Cauliflower is a great source of the antioxidant indole-3-carbinol (I3C), which may help reduce the risk of some cancers, like breast cancer. 12. Ditch the ice cream sundae in favor of a [place] yogurt parfait. Instead of a cup of ice cream or fro-yo for dessert, try 1 cup of nonfat Greek yogurt topped with fresh berries and a sprinkle of cacao nibs. Both toppings are packed with antioxidants, which can help reduce cellular inflammation and oxidative damage. And the comparison is a no-brainer: One cup of ice cream has about 275 calories; one cup of frozen yogurt has about 230; and a cup of Greek yogurt has just 130, plus twice the protein, so you're less likely to return to the freezer for a second helping. 13. Put olive oil in a spray container instead of using it  directly from the bottle. Each tablespoon of olive oil is 120 calories and 15g of fat. Use a mister instead of pouring it straight into the pan or onto a salad. This allows for portion control and will save you more than 100 calories. 14. When baking, substitute canned pumpkin for butter or oil. Canned pumpkin-not pumpkin pie mix-is loaded with Vitamin A, which is important for skin and eye health, as well as immunity. And the comparisons are pretty crazy:  cup of canned pumpkin has about 40 calories, compared to butter or oil, which has more than 800 calories.  Yes, 800 calories. Applesauce and mashed banana can also serve as good substitutions for butter or oil, usually in a 1:1 ratio. 15. Top casseroles with high-fiber cereal instead of breadcrumbs. Breadcrumbs are typically made with white bread, while breakfast cereals contain 5-9g of fiber per serving. Not only will you save more than 150 calories per  cup serving, the swap will also keep you more full and you'll get a metabolism boost from the added fiber. 16. Snack on pistachios instead of macadamia nuts. Believe it or not, you get the same amount of calories from 35 pistachios (100 calories) as you would from only five macadamia nuts. 17. Chow down on kale chips rather than potato chips. This is my favorite 'don't knock it 'till you try it' swap. Kale chips are so easy to make at home, and you can spice them up with a little grated parmesan or chili powder. Plus, they're a mere fraction of the calories of potato chips, but with the same crunch factor we crave so often. 18. Add seltzer and some fruit slices to your cocktail instead of soda or fruit juice. One cup of soda or fruit juice can pack on as much as 140 calories. Instead, use seltzer and fruit slices. The fruit provides valuable phytochemicals, such as flavonoids and anthocyanins, which help to combat cancer and stave off the aging process.     Bad carbs also include fruit juice, alcohol, and sweet tea. These are empty calories that do not signal to your brain that you are full.   Please remember the good carbs are still carbs which convert into sugar. So please measure them out no more than 1/2-1 cup of rice, oatmeal, pasta, and beans  Veggies are however free foods! Pile them on.   Not all fruit is created equal. Please see the list below, the fruit at the bottom is higher in sugars than the fruit at the top. Please avoid all dried fruits.

## 2015-02-15 NOTE — Addendum Note (Signed)
Addended by: Quentin Mulling R on: 02/15/2015 12:16 PM   Modules accepted: Orders

## 2015-02-16 LAB — HEPATIC FUNCTION PANEL
ALT: 17 U/L (ref 0–35)
AST: 17 U/L (ref 0–37)
Albumin: 4.2 g/dL (ref 3.5–5.2)
Alkaline Phosphatase: 52 U/L (ref 39–117)
Bilirubin, Direct: 0.2 mg/dL (ref 0.0–0.3)
Indirect Bilirubin: 0.4 mg/dL (ref 0.2–1.2)
TOTAL PROTEIN: 7.2 g/dL (ref 6.0–8.3)
Total Bilirubin: 0.6 mg/dL (ref 0.2–1.2)

## 2015-02-16 LAB — URINALYSIS, ROUTINE W REFLEX MICROSCOPIC
Bilirubin Urine: NEGATIVE
Glucose, UA: >1000 mg/dL — AB
Hgb urine dipstick: NEGATIVE
KETONES UR: NEGATIVE mg/dL
Leukocytes, UA: NEGATIVE
NITRITE: NEGATIVE
PH: 5 (ref 5.0–8.0)
Protein, ur: NEGATIVE mg/dL
SPECIFIC GRAVITY, URINE: 1.029 (ref 1.005–1.030)
Urobilinogen, UA: 0.2 mg/dL (ref 0.0–1.0)

## 2015-02-16 LAB — URINALYSIS, MICROSCOPIC ONLY
Bacteria, UA: NONE SEEN
Casts: NONE SEEN
Crystals: NONE SEEN

## 2015-02-16 LAB — LIPID PANEL
CHOLESTEROL: 129 mg/dL (ref 0–200)
HDL: 51 mg/dL (ref >=46)
LDL Cholesterol: 64 mg/dL (ref 0–99)
Total CHOL/HDL Ratio: 2.5 Ratio
Triglycerides: 71 mg/dL (ref <150)
VLDL: 14 mg/dL (ref 0–40)

## 2015-02-16 LAB — BASIC METABOLIC PANEL WITH GFR
BUN: 8 mg/dL (ref 6–23)
CO2: 28 mEq/L (ref 19–32)
CREATININE: 0.65 mg/dL (ref 0.50–1.10)
Calcium: 9.9 mg/dL (ref 8.4–10.5)
Chloride: 97 mEq/L (ref 96–112)
GFR, Est African American: >89 mL/min
Glucose, Bld: 226 mg/dL — ABNORMAL HIGH (ref 70–99)
Potassium: 4.2 mEq/L (ref 3.5–5.3)
Sodium: 134 mEq/L — ABNORMAL LOW (ref 135–145)

## 2015-02-16 LAB — MAGNESIUM: MAGNESIUM: 1.6 mg/dL (ref 1.5–2.5)

## 2015-02-16 LAB — INSULIN, FASTING: Insulin fasting, serum: 5.7 u[IU]/mL (ref 2.0–19.6)

## 2015-02-16 LAB — TSH: TSH: 0.808 u[IU]/mL (ref 0.350–4.500)

## 2015-02-16 LAB — VITAMIN D 25 HYDROXY (VIT D DEFICIENCY, FRACTURES): Vit D, 25-Hydroxy: 35 ng/mL (ref 30–100)

## 2015-02-17 LAB — URINE CULTURE
Colony Count: NO GROWTH
ORGANISM ID, BACTERIA: NO GROWTH

## 2015-03-24 ENCOUNTER — Ambulatory Visit: Payer: Self-pay | Admitting: Physician Assistant

## 2015-03-26 ENCOUNTER — Other Ambulatory Visit: Payer: Self-pay | Admitting: Internal Medicine

## 2015-05-25 ENCOUNTER — Other Ambulatory Visit: Payer: Self-pay | Admitting: Internal Medicine

## 2015-05-25 ENCOUNTER — Other Ambulatory Visit: Payer: Self-pay | Admitting: Emergency Medicine

## 2015-05-25 ENCOUNTER — Other Ambulatory Visit: Payer: Self-pay | Admitting: Physician Assistant

## 2015-05-26 ENCOUNTER — Ambulatory Visit: Payer: Self-pay | Admitting: Physician Assistant

## 2015-05-27 ENCOUNTER — Ambulatory Visit (INDEPENDENT_AMBULATORY_CARE_PROVIDER_SITE_OTHER): Payer: BLUE CROSS/BLUE SHIELD | Admitting: Physician Assistant

## 2015-05-27 ENCOUNTER — Encounter: Payer: Self-pay | Admitting: Physician Assistant

## 2015-05-27 VITALS — BP 126/82 | HR 72 | Temp 97.9°F | Resp 16 | Ht 62.0 in | Wt 154.8 lb

## 2015-05-27 DIAGNOSIS — E119 Type 2 diabetes mellitus without complications: Secondary | ICD-10-CM | POA: Diagnosis not present

## 2015-05-27 DIAGNOSIS — I10 Essential (primary) hypertension: Secondary | ICD-10-CM | POA: Diagnosis not present

## 2015-05-27 DIAGNOSIS — E559 Vitamin D deficiency, unspecified: Secondary | ICD-10-CM | POA: Diagnosis not present

## 2015-05-27 DIAGNOSIS — Z79899 Other long term (current) drug therapy: Secondary | ICD-10-CM

## 2015-05-27 DIAGNOSIS — E785 Hyperlipidemia, unspecified: Secondary | ICD-10-CM | POA: Diagnosis not present

## 2015-05-27 DIAGNOSIS — D509 Iron deficiency anemia, unspecified: Secondary | ICD-10-CM

## 2015-05-27 LAB — CBC WITH DIFFERENTIAL/PLATELET
Basophils Absolute: 0 10*3/uL (ref 0.0–0.1)
Basophils Relative: 1 % (ref 0–1)
EOS PCT: 1 % (ref 0–5)
Eosinophils Absolute: 0 10*3/uL (ref 0.0–0.7)
HEMATOCRIT: 38.9 % (ref 36.0–46.0)
HEMOGLOBIN: 13 g/dL (ref 12.0–15.0)
Lymphocytes Relative: 41 % (ref 12–46)
Lymphs Abs: 1.9 10*3/uL (ref 0.7–4.0)
MCH: 26.5 pg (ref 26.0–34.0)
MCHC: 33.4 g/dL (ref 30.0–36.0)
MCV: 79.2 fL (ref 78.0–100.0)
MONO ABS: 0.3 10*3/uL (ref 0.1–1.0)
MONOS PCT: 6 % (ref 3–12)
MPV: 9 fL (ref 8.6–12.4)
NEUTROS ABS: 2.3 10*3/uL (ref 1.7–7.7)
Neutrophils Relative %: 51 % (ref 43–77)
Platelets: 302 10*3/uL (ref 150–400)
RBC: 4.91 MIL/uL (ref 3.87–5.11)
RDW: 13.2 % (ref 11.5–15.5)
WBC: 4.6 10*3/uL (ref 4.0–10.5)

## 2015-05-27 NOTE — Progress Notes (Signed)
Assessment and Plan:  Hypertension: Continue medication, monitor blood pressure at home. Continue DASH diet.  Reminder to go to the ER if any CP, SOB, nausea, dizziness, severe HA, changes vision/speech, left arm numbness and tingling and jaw pain. Cholesterol: Continue diet and exercise. Check cholesterol.  Diabetes without complications-Continue diet and exercise. Check A1C, given samples of onglyza, STOP sodas/sugars in coffee, follow up 1 month DM teaching Vitamin D Def- check level and continue medications.  Right breast: warm compresses, if not better get MGM with [place].   Continue diet and meds as discussed. Further disposition pending results of labs. Discussed med's effects and SE's.     HPI 44 y.o. female  presents for overdue 3 month follow up  has a past medical history of Diabetes mellitus without complication and Unspecified vitamin D deficiency.  Her blood pressure has been controlled at home, today her BP is BP: 126/82 mmHg.  She does workout. She denies chest pain, shortness of breath, dizziness.  She is on cholesterol medication. lipitor 40 and denies myalgias. Her cholesterol is at goal. The cholesterol was:  02/15/2015: Cholesterol 129; HDL 51; LDL Cholesterol 64; Triglycerides 71  She has been working on diet and exercise for diabetes without complications, she is not on bASA, she is on ACE/ARB, she is on metformin 1000 IU due to diarrhea/nausea, not checking sugar at home and denies  paresthesia of the feet, polydipsia, polyuria and visual disturbances. Last A1C was: 02/15/2015: Hgb A1c MFr Bld 11.4*  Lab Results  Component Value Date   GFRAA >89 02/15/2015  She is on zantac for GERD and states it is helping.   Patient is on Vitamin D supplement. 02/15/2015: Vit D, 25-Hydroxy 35  BMI is Body mass index is 28.31 kg/(m^2)., she is working on diet and exercise. Wt Readings from Last 3 Encounters:  05/27/15 154 lb 12.8 oz (70.217 kg)  02/15/15 154 lb (69.854 kg)  11/12/14 159 lb  (72.122 kg)     Current Medications:  Current Outpatient Prescriptions on File Prior to Visit  Medication Sig Dispense Refill  . atorvastatin (LIPITOR) 40 MG tablet TAKE ONE TABLET BY MOUTH ONCE DAILY FOR CHOLESTEROL 30 tablet 0  . glucose monitoring kit (FREESTYLE) monitoring kit 1 each by Does not apply route as needed for other. Please fill strips 1 qd 100 each 5  . lisinopril (PRINIVIL,ZESTRIL) 10 MG tablet TAKE ONE TABLET BY MOUTH ONCE DAILY FOR BLOOD PRESSURE AND  KIDNEYS 90 tablet 0  . metFORMIN (GLUCOPHAGE-XR) 500 MG 24 hr tablet TAKE TWO TABLETS BY MOUTH TWICE DAILY FOR DIABETES 120 tablet 0  . mometasone (ELOCON) 0.1 % cream APPLY  CREAM TO AFFECTED AREA ONCE DAILY 45 g 1  . ranitidine (ZANTAC) 300 MG tablet Take twice a day while trying to get off PPI, then can go to once at night 60 tablet 3   No current facility-administered medications on file prior to visit.   Medical History:  Past Medical History  Diagnosis Date  . Diabetes mellitus without complication   . Unspecified vitamin D deficiency    Allergies: No Known Allergies   Review of Systems:  Review of Systems  Constitutional: Negative.   Eyes: Negative.   Respiratory: Negative for cough, hemoptysis, sputum production, shortness of breath and wheezing.   Cardiovascular: Negative for chest pain, palpitations, orthopnea, claudication, leg swelling and PND.  Gastrointestinal: Negative for heartburn, nausea, vomiting, abdominal pain, diarrhea, constipation, blood in stool and melena.  Genitourinary: Positive for frequency (states it  smells sweet). Negative for dysuria, urgency, hematuria and flank pain.  Musculoskeletal: Positive for neck pain. Negative for myalgias, back pain, joint pain and falls.  Skin: Negative.   Neurological: Positive for tingling and sensory change (arms). Negative for dizziness, tremors, speech change, focal weakness, seizures and loss of consciousness.  Endo/Heme/Allergies: Positive for  polydipsia.  Psychiatric/Behavioral: Negative.     Family history- Review and unchanged Social history- Review and unchanged Physical Exam: BP 126/82 mmHg  Pulse 72  Temp(Src) 97.9 F (36.6 C)  Resp 16  Ht 5' 2"  (1.575 m)  Wt 154 lb 12.8 oz (70.217 kg)  BMI 28.31 kg/m2 Wt Readings from Last 3 Encounters:  05/27/15 154 lb 12.8 oz (70.217 kg)  02/15/15 154 lb (69.854 kg)  11/12/14 159 lb (72.122 kg)   General Appearance: Well nourished, in no apparent distress. Eyes: PERRLA, EOMs, conjunctiva no swelling or erythema Sinuses: No Frontal/maxillary tenderness ENT/Mouth: Ext aud canals clear, TMs without erythema, bulging. No erythema, swelling, or exudate on post pharynx.  Tonsils not swollen or erythematous. Hearing normal.  Neck: Supple, thyroid normal.  Respiratory: Respiratory effort normal, BS equal bilaterally without rales, rhonchi, wheezing or stridor.  Breast: right lateral breast, tender erythematous area, no warmth, mobile.  Cardio: RRR with LSB murmu. Brisk peripheral pulses without edema.  Abdomen: Soft, + BS. Obese, no guarding, rebound, hernias, masses. Lymphatics: Non tender without lymphadenopathy.  Musculoskeletal: Full ROM, 5/5 strength, Normal gait. Skin: Warm, dry without rashes, lesions, ecchymosis.  Neuro: Cranial nerves intact. No cerebellar symptoms.  Psych: Awake and oriented X 3, normal affect, Insight and Judgment appropriate.    Vicie Mutters, PA-C 10:54 AM [facility]

## 2015-05-27 NOTE — Patient Instructions (Addendum)
Add ENTERIC COATED low dose 81 mg Aspirin daily OR can do every other day if you have easy bruising to protect your heart and head. As well as to reduce risk of Colon Cancer by 20 %, Skin Cancer by 26 % , Melanoma by 46% and Pancreatic cancer by 60%     Bad carbs also include fruit juice, alcohol, and sweet tea. These are empty calories that do not signal to your brain that you are full.   Please remember the good carbs are still carbs which convert into sugar. So please measure them out no more than 1/2-1 cup of rice, oatmeal, pasta, and beans  Veggies are however free foods! Pile them on.   Not all fruit is created equal. Please see the list below, the fruit at the bottom is higher in sugars than the fruit at the top. Please avoid all dried fruits.      Vitamin D goal is between 60-80  Please make sure that you are taking your Vitamin D as directed.   It is very important as a natural anti-inflammatory   helping hair, skin, and nails, as well as reducing stroke and heart attack risk.   It helps your bones and helps with mood.  It also decreases numerous cancer risks so please take it as directed.   Low Vit D is associated with a 200-300% higher risk for CANCER   and 200-300% higher risk for HEART   ATTACK  &  STROKE.    .....................................Marland Kitchen  It is also associated with higher death rate at younger ages,   autoimmune diseases like Rheumatoid arthritis, Lupus, Multiple Sclerosis.     Also many other serious conditions, like depression, Alzheimer's  Dementia, infertility, muscle aches, fatigue, fibromyalgia - just to name a few.  +++++++++++++++++++   Diabetes is a very complicated disease...lets simplify it.  An easy way to look at it to understand the complications is if you think of the extra sugar floating in your blood stream as glass shards floating through your blood stream.    Diabetes affects your small vessels first: 1) The glass shards  (sugar) scraps down the tiny blood vessels in your eyes and lead to diabetic retinopathy, the leading cause of blindness in the [place]. Diabetes is the leading cause of newly diagnosed adult (64 to 43 years of age) blindness in the [place].  2) The glass shards scratches down the tiny vessels of your legs leading to nerve damage called neuropathy and can lead to amputations of your feet. More than 60% of all non-traumatic amputations of lower limbs occur in people with diabetes.  3) Over time the small vessels in your brain are shredded and closed off, individually this does not cause any problems but over a long period of time many of the small vessels being blocked can lead to Vascular Dementia.   4) Your kidney's are a filter system and have a "net" that keeps certain things in the body and lets bad things out. Sugar shreds this net and leads to kidney damage and eventually failure. Decreasing the sugar that is destroying the net and certain blood pressure medications can help stop or decrease progression of kidney disease. Diabetes was the primary cause of kidney failure in 44 percent of all new cases in 2011.  5) Diabetes also destroys the small vessels in your penis that lead to erectile dysfunction. Eventually the vessels are so damaged that you may not be responsive to cialis or viagra.  Diabetes and your large vessels: Your larger vessels consist of your coronary arteries in your heart and the carotid vessels to your brain. Diabetes or even increased sugars put you at 300% increased risk of heart attack and stroke and this is why.. The sugar scrapes down your large blood vessels and your body sees this as an internal injury and tries to repair itself. Just like you get a scab on your skin, your platelets will stick to the blood vessel wall trying to heal it. This is why we have diabetics on low dose aspirin daily, this prevents the platelets from sticking and can prevent plaque formation. In  addition, your body takes cholesterol and tries to shove it into the open wound. This is why we want your LDL, or bad cholesterol, below 70.   The combination of platelets and cholesterol over 5-10 years forms plaque that can break off and cause a heart attack or stroke.   PLEASE REMEMBER:  Diabetes is preventable! Up to 85 percent of complications and morbidities among individuals with type 2 diabetes can be prevented, delayed, or effectively treated and minimized with regular visits to a health professional, appropriate monitoring and medication, and a healthy diet and lifestyle.

## 2015-05-28 LAB — HEPATIC FUNCTION PANEL
ALT: 14 U/L (ref 6–29)
AST: 14 U/L (ref 10–30)
Albumin: 3.9 g/dL (ref 3.6–5.1)
Alkaline Phosphatase: 46 U/L (ref 33–115)
BILIRUBIN DIRECT: 0.1 mg/dL (ref <=0.2)
Indirect Bilirubin: 0.3 mg/dL (ref 0.2–1.2)
TOTAL PROTEIN: 6.5 g/dL (ref 6.1–8.1)
Total Bilirubin: 0.4 mg/dL (ref 0.2–1.2)

## 2015-05-28 LAB — IRON AND TIBC
%SAT: 31 % (ref 11–50)
IRON: 102 ug/dL (ref 40–190)
TIBC: 332 ug/dL (ref 250–450)
UIBC: 230 ug/dL (ref 125–400)

## 2015-05-28 LAB — LIPID PANEL
CHOLESTEROL: 186 mg/dL (ref 125–200)
HDL: 43 mg/dL — ABNORMAL LOW (ref >=46)
LDL Cholesterol: 91 mg/dL (ref <130)
Total CHOL/HDL Ratio: 4.3 Ratio (ref <=5.0)
Triglycerides: 259 mg/dL — ABNORMAL HIGH (ref <150)
VLDL: 52 mg/dL — AB (ref <30)

## 2015-05-28 LAB — VITAMIN D 25 HYDROXY (VIT D DEFICIENCY, FRACTURES): Vit D, 25-Hydroxy: 22 ng/mL — ABNORMAL LOW (ref 30–100)

## 2015-05-28 LAB — BASIC METABOLIC PANEL WITH GFR
BUN: 9 mg/dL (ref 7–25)
CALCIUM: 9.4 mg/dL (ref 8.6–10.2)
CO2: 27 mmol/L (ref 20–31)
Chloride: 100 mmol/L (ref 98–110)
Creat: 0.74 mg/dL (ref 0.50–1.10)
GFR, Est African American: >89 mL/min (ref >=60)
GFR, Est Non African American: >89 mL/min (ref >=60)
GLUCOSE: 379 mg/dL — AB (ref 65–99)
Potassium: 4.3 mmol/L (ref 3.5–5.3)
Sodium: 140 mmol/L (ref 135–146)

## 2015-05-28 LAB — INSULIN, FASTING: INSULIN FASTING, SERUM: 39.8 u[IU]/mL — AB (ref 2.0–19.6)

## 2015-05-28 LAB — HEMOGLOBIN A1C
Hgb A1c MFr Bld: 11 % — ABNORMAL HIGH (ref <5.7)
Mean Plasma Glucose: 269 mg/dL — ABNORMAL HIGH (ref <117)

## 2015-05-28 LAB — MAGNESIUM: MAGNESIUM: 1.3 mg/dL — AB (ref 1.5–2.5)

## 2015-05-28 LAB — TSH: TSH: 0.52 u[IU]/mL (ref 0.350–4.500)

## 2015-05-28 LAB — FERRITIN: FERRITIN: 12 ng/mL (ref 10–291)

## 2015-06-25 ENCOUNTER — Encounter: Payer: Self-pay | Admitting: Physician Assistant

## 2015-06-25 ENCOUNTER — Ambulatory Visit (INDEPENDENT_AMBULATORY_CARE_PROVIDER_SITE_OTHER): Payer: BLUE CROSS/BLUE SHIELD | Admitting: Physician Assistant

## 2015-06-25 ENCOUNTER — Other Ambulatory Visit: Payer: Self-pay | Admitting: Physician Assistant

## 2015-06-25 VITALS — BP 110/60 | HR 72 | Temp 97.7°F | Resp 16 | Ht 62.0 in | Wt 152.0 lb

## 2015-06-25 DIAGNOSIS — B379 Candidiasis, unspecified: Secondary | ICD-10-CM | POA: Diagnosis not present

## 2015-06-25 DIAGNOSIS — E785 Hyperlipidemia, unspecified: Secondary | ICD-10-CM

## 2015-06-25 DIAGNOSIS — E119 Type 2 diabetes mellitus without complications: Secondary | ICD-10-CM

## 2015-06-25 DIAGNOSIS — I10 Essential (primary) hypertension: Secondary | ICD-10-CM | POA: Diagnosis not present

## 2015-06-25 MED ORDER — FLUCONAZOLE 150 MG PO TABS: 150.0000 mg | ORAL_TABLET | Freq: Once | ORAL | Status: AC

## 2015-06-25 MED ORDER — GLIPIZIDE 5 MG PO TABS: 5.0000 mg | ORAL_TABLET | Freq: Two times a day (BID) | ORAL | Status: AC

## 2015-06-25 MED ORDER — SITAGLIPTIN-METFORMIN HCL ER 100-1000 MG PO TB24
ORAL_TABLET | ORAL | Status: DC
Start: 2015-06-25 — End: 2015-06-25

## 2015-06-25 MED ORDER — ALOGLIPTIN BENZOATE 25 MG PO TABS: ORAL_TABLET | ORAL | Status: AC

## 2015-06-25 NOTE — Patient Instructions (Signed)
Do janumet once daily with food, continue the metformin twice daily, and add on the glipizide 5 mg twice daily WITH food.   Follow up 2 weeks, DM visit  Diabetes is a very complicated disease...lets simplify it.  An easy way to look at it to understand the complications is if you think of the extra sugar floating in your blood stream as glass shards floating through your blood stream.    Diabetes affects your small vessels first: 1) The glass shards (sugar) scraps down the tiny blood vessels in your eyes and lead to diabetic retinopathy, the leading cause of blindness in the [place]. Diabetes is the leading cause of newly diagnosed adult (5720 to 43 years of age) blindness in the [place].  2) The glass shards scratches down the tiny vessels of your legs leading to nerve damage called neuropathy and can lead to amputations of your feet. More than 60% of all non-traumatic amputations of lower limbs occur in people with diabetes.  3) Over time the small vessels in your brain are shredded and closed off, individually this does not cause any problems but over a long period of time many of the small vessels being blocked can lead to Vascular Dementia.   4) Your kidney's are a filter system and have a "net" that keeps certain things in the body and lets bad things out. Sugar shreds this net and leads to kidney damage and eventually failure. Decreasing the sugar that is destroying the net and certain blood pressure medications can help stop or decrease progression of kidney disease. Diabetes was the primary cause of kidney failure in 44 percent of all new cases in 2011.  5) Diabetes also destroys the small vessels in your penis that lead to erectile dysfunction. Eventually the vessels are so damaged that you may not be responsive to cialis or viagra.   Diabetes and your large vessels: Your larger vessels consist of your coronary arteries in your heart and the carotid vessels to your brain. Diabetes or  even increased sugars put you at 300% increased risk of heart attack and stroke and this is why.. The sugar scrapes down your large blood vessels and your body sees this as an internal injury and tries to repair itself. Just like you get a scab on your skin, your platelets will stick to the blood vessel wall trying to heal it. This is why we have diabetics on low dose aspirin daily, this prevents the platelets from sticking and can prevent plaque formation. In addition, your body takes cholesterol and tries to shove it into the open wound. This is why we want your LDL, or bad cholesterol, below 70.   The combination of platelets and cholesterol over 5-10 years forms plaque that can break off and cause a heart attack or stroke.   PLEASE REMEMBER:  Diabetes is preventable! Up to 85 percent of complications and morbidities among individuals with type 2 diabetes can be prevented, delayed, or effectively treated and minimized with regular visits to a health professional, appropriate monitoring and medication, and a healthy diet and lifestyle.   Your A1C is a measure of your sugar over the past 3 months and is not affected by what you have eaten over the past few days. Diabetes increases your chances of stroke and heart attack over 300 % and is the leading cause of blindness and kidney failure in the [place]. Please make sure you decrease bad carbs like white bread, white rice, potatoes, corn, soft  drinks, pasta, cereals, refined sugars, sweet tea, dried fruits, and fruit juice. Good carbs are okay to eat in moderation like sweet potatoes, brown rice, whole grain pasta/bread, most fruit (except dried fruit) and you can eat as many veggies as you want.   Greater than 6.5 is considered diabetic. Between 6.4 and 5.7 is prediabetic If your A1C is less than 5.7 you are NOT diabetic.  Targets for Glucose Readings: Time of Check Target for patients WITHOUT Diabetes Target for DIABETICS  Before Meals Less  than 100  less than 150  Two hours after meals Less than 200  Less than 250    Recommendations For Diabetic/Prediabetic Patients:   -  Take medications as prescribed  -  Recommend Dr Francis Dowse Fuhrman's book "The End of Diabetes "  And "The End of Dieting"- Can get at  www.Amazon.com and encourage also get the Audio CD book  - AVOID Animal products, ie. Meat - red/white, Poultry and Dairy/especially cheese - Exercise at least 5 times a week for 30 minutes or preferably daily.  - No Smoking - Drink less than 2 drinks a day.  - Monitor your feet for sores - Have yearly Eye Exams - Recommend annual Flu vaccine  - Recommend Pneumovax and Prevnar vaccines - Shingles Vaccine (Zostavax) if over 6 y.o.  Goals:   - BMI less than 24 - Fasting sugar less than 130 or less than 150 if tapering medicines to lose weight  - Systolic BP less than 130  - Diastolic BP less than 80 - Bad LDL Cholesterol less than 70 - Triglycerides less than 150

## 2015-06-25 NOTE — Progress Notes (Signed)
Diabetes Education and Follow-Up Visit  44 y.o.female presents for diabetic education but showed up late, and only had 15 min time slot. She has very poor insight to disease process.  She complains of polydipsia, polyuria and yeast infection. Patient denies foot ulcerations, hypoglycemia , nausea, paresthesia of the feet and visual disturbances. The patient is on an ACE/ARB, she is on a low dose aspirin at this time. The patient does not exercise. She does not currently smoke, and does not currently drink alcohol.  The patient is not checking her sugars at home due to not like needles.    Current Outpatient Prescriptions on File Prior to Visit  Medication Sig Dispense Refill  . atorvastatin (LIPITOR) 40 MG tablet TAKE ONE TABLET BY MOUTH ONCE DAILY FOR CHOLESTEROL 30 tablet 0  . glucose monitoring kit (FREESTYLE) monitoring kit 1 each by Does not apply route as needed for other. Please fill strips 1 qd 100 each 5  . lisinopril (PRINIVIL,ZESTRIL) 10 MG tablet TAKE ONE TABLET BY MOUTH ONCE DAILY FOR BLOOD PRESSURE AND  KIDNEYS 90 tablet 0  . metFORMIN (GLUCOPHAGE-XR) 500 MG 24 hr tablet TAKE TWO TABLETS BY MOUTH TWICE DAILY FOR DIABETES 120 tablet 0  . mometasone (ELOCON) 0.1 % cream APPLY  CREAM TO AFFECTED AREA ONCE DAILY 45 g 1  . ranitidine (ZANTAC) 300 MG tablet Take twice a day while trying to get off PPI, then can go to once at night 60 tablet 3   No current facility-administered medications on file prior to visit.    Past Medical History  Diagnosis Date  . Diabetes mellitus without complication ([place])   . Unspecified vitamin D deficiency      Wt Readings from Last 3 Encounters:  06/25/15 152 lb (68.947 kg)  05/27/15 154 lb 12.8 oz (70.217 kg)  02/15/15 154 lb (69.854 kg)    ROS: no chest pain, dyspnea or TIA's, no numbness, tingling or pain in extremities, no unusual visual symptoms, no hypoglycemia  Physical Exam: Blood pressure 110/60, pulse 72, temperature 97.7 F (36.5 C),  temperature source Temporal, resp. rate 16, height _0  (1.575 m), weight 152 lb (68.947 kg), last menstrual period 06/11/2015, SpO2 99 %. Body mass index is 27.79 kg/(m^2). General Appearance:  alert, oriented, no acute distress and obese heart sounds regular rate and rhythm, S1, S2 normal, no murmur, click, rub or gallop, chest clear, no hepatosplenomegaly  Labs: Lab Results  Component Value Date   HGBA1C 11.0* 05/27/2015    No results found for: Derl Barrow  Lab Results  Component Value Date   CHOL 186 05/27/2015   HDL 43* 05/27/2015   LDLCALC 91 05/27/2015   TRIG 259* 05/27/2015   CHOLHDL 4.3 05/27/2015     Plan and Assessment: Diabetes Mellitus type 2:  Diabetes mellitus Type II, under poor control. with poor insight to disease Education: Reviewed 'ABCs' of diabetes management (respective goals in parentheses):  A1C (<7), blood pressure (<130/80), and cholesterol (LDL <100) Will add on glipizide 42m BID largest meals, add on Janumet  Follow up 2 weeks DM education visit 30 mins time slot come on time.  Discussed injections/referral to endocrine, declines at this time.  Eye Exam yearly and Dental Exam every 6 months. Dietary recommendations Physical Activity recommendations  Compliance at present is estimated to be poor. Efforts to improve compliance (if necessary) will be directed at dietary modifications: decrease carbs, will discuss DM referral and close follow up. .   Blood pressure: normal blood pressure.  An ACE/ARB is currently part of their treatment regimen.  LDL goal of < 100, HDL > 40 and TG < 150. Dyslipidemia under fair control. A statin is currently part of their treatment regimen.  Follow up: 2 weeks for DM education visit, need at least 30 min time slot.

## 2015-07-01 ENCOUNTER — Other Ambulatory Visit: Payer: Self-pay | Admitting: Physician Assistant

## 2015-07-09 ENCOUNTER — Ambulatory Visit (INDEPENDENT_AMBULATORY_CARE_PROVIDER_SITE_OTHER): Payer: BLUE CROSS/BLUE SHIELD | Admitting: Physician Assistant

## 2015-07-09 ENCOUNTER — Encounter: Payer: Self-pay | Admitting: Physician Assistant

## 2015-07-09 VITALS — BP 110/82 | HR 62 | Temp 97.7°F | Resp 16 | Ht 62.0 in | Wt 151.0 lb

## 2015-07-09 DIAGNOSIS — E119 Type 2 diabetes mellitus without complications: Secondary | ICD-10-CM | POA: Diagnosis not present

## 2015-07-09 NOTE — Patient Instructions (Addendum)
[facility]  7 a.m.-6:30 p.m., Monday 7 a.m.-5 p.m., Tuesday-Friday Schedule an appointment by calling [phone].  [facility] Schedule an appointment by calling [phone].  We are going to do janument 100/1000 once daily and and  metformin 2 a day, and glipizide once daily, check your sugars.   Warm compresses once or twice daily on your right breast   Bad carbs also include fruit juice, alcohol, and sweet tea. These are empty calories that do not signal to your brain that you are full.   Please remember the good carbs are still carbs which convert into sugar. So please measure them out no more than 1/2-1 cup of rice, oatmeal, pasta, and beans  Veggies are however free foods! Pile them on.   Not all fruit is created equal. Please see the list below, the fruit at the bottom is higher in sugars than the fruit at the top. Please avoid all dried fruits.     Your A1C is a measure of your sugar over the past 3 months and is not affected by what you have eaten over the past few days. Diabetes increases your chances of stroke and heart attack over 300 % and is the leading cause of blindness and kidney failure in the [place]. Please make sure you decrease bad carbs like white bread, white rice, potatoes, corn, soft drinks, pasta, cereals, refined sugars, sweet tea, dried fruits, and fruit juice. Good carbs are okay to eat in moderation like sweet potatoes, brown rice, whole grain pasta/bread, most fruit (except dried fruit) and you can eat as many veggies as you want.   Greater than 6.5 is considered diabetic. Between 6.4 and 5.7 is prediabetic If your A1C is less than 5.7 you are NOT diabetic.  Targets for Glucose Readings: Time of Check Target for patients WITHOUT Diabetes Target for DIABETICS  Before Meals/ in the AM fasting Less than 100  less than 150  Two hours after meals Less than 200  Less than 250   Targets for Glucose  Readings: Time of Check Usual Target for Most People  Before Meals  70-130  Two hours after meals  Less than 180  Bedtime  90-150   Why Should You Check Your Blood Glucose? -The A1C tells you how your diabetes is doing over a 3 month period.  Home blood glucose monitoring (or checking) gives you information about your diabetes on a daily basis.  You will learn how well your diabetes care plan is working and whether your blood glucose is in your target range throughout the day.   -Reviewing daily blood glucose levels will help you and your healthcare team make any needed changes to you meal plan, physical activity and medications.  Meter Supplies: - A glucose meter was provided at your visit today along with strips and lancets. The blood glucose meter strips and lancets are usually covered by health insurance. Please let us know if they are not and contact your insurance to see which meter they prefer.  How to get a good blood sample: -Wash your hands in warm water.  You do not need to use alcohol wipes. -Massage your hands. -Choose which finger you will use.  It helps to use a different finger each time to avoid soreness. -Keep you hand below your wrist when using you lancet to "prick" your finger. -Apply gentle pressure, but do NOT squeeze your finger.  Checking your blood glucose Important Reminders: -Make sure your  strips are not expired.  Check the date on the bottle. -Make sure the code on bottle matches the code on your machine. -Make sure your hands are clean and dry. -Do not use the center of your finger, it is the most sensitive area.  Use a spot to the side of the center of your fingertip. -Completely fill the strip target area with blood (until it beeps) to make sure the results are accurate. -You will need a prescription to have your glucose strips and lancets covered by insurance. -There is usually an 800 number on the back of your meter for help with meter issues.  How  often to check: -If you take diabetes pills or take one injection of insulin each day, you will usually be asked to check twice a day, before breakfast and 2 hours after one meal, or as directed. -If you take several insulin injections each day, you will usually be asked to check four times a day before meals and at bedtime every day.  Diabetes is a very complicated disease...lets simplify it.  An easy way to look at it to understand the complications is if you think of the extra sugar floating in your blood stream as glass shards floating through your blood stream.    Diabetes affects your small vessels first: 1) The glass shards (sugar) scraps down the tiny blood vessels in your eyes and lead to diabetic retinopathy, the leading cause of blindness in the [place]. Diabetes is the leading cause of newly diagnosed adult (46 to 43 years of age) blindness in the [place].  2) The glass shards scratches down the tiny vessels of your legs leading to nerve damage called neuropathy and can lead to amputations of your feet. More than 60% of all non-traumatic amputations of lower limbs occur in people with diabetes.  3) Over time the small vessels in your brain are shredded and closed off, individually this does not cause any problems but over a long period of time many of the small vessels being blocked can lead to Vascular Dementia.   4) Your kidney's are a filter system and have a "net" that keeps certain things in the body and lets bad things out. Sugar shreds this net and leads to kidney damage and eventually failure. Decreasing the sugar that is destroying the net and certain blood pressure medications can help stop or decrease progression of kidney disease. Diabetes was the primary cause of kidney failure in 44 percent of all new cases in 2011.  5) Diabetes also destroys the small vessels in your penis that lead to erectile dysfunction. Eventually the vessels are so damaged that you may not be  responsive to cialis or viagra.   Diabetes and your large vessels: Your larger vessels consist of your coronary arteries in your heart and the carotid vessels to your brain. Diabetes or even increased sugars put you at 300% increased risk of heart attack and stroke and this is why.. The sugar scrapes down your large blood vessels and your body sees this as an internal injury and tries to repair itself. Just like you get a scab on your skin, your platelets will stick to the blood vessel wall trying to heal it. This is why we have diabetics on low dose aspirin daily, this prevents the platelets from sticking and can prevent plaque formation. In addition, your body takes cholesterol and tries to shove it into the open wound. This is why we want your LDL, or bad cholesterol,  below 70.   The combination of platelets and cholesterol over 5-10 years forms plaque that can break off and cause a heart attack or stroke.   PLEASE REMEMBER:  Diabetes is preventable! Up to 85 percent of complications and morbidities among individuals with type 2 diabetes can be prevented, delayed, or effectively treated and minimized with regular visits to a health professional, appropriate monitoring and medication, and a healthy diet and lifestyle.

## 2015-07-09 NOTE — Progress Notes (Signed)
Diabetes Education and Follow-Up Visit  44 y.o.female presents for diabetic education. She has very poor insight to disease process. She is on janumet 100/1000 mg, has been taking twice a day, glipizide 5 mg once daily.   She complains of none. Patient denies foot ulcerations, hypoglycemia , nausea, paresthesia of the feet and visual disturbances. The patient is on an ACE/ARB, she is on a low dose aspirin at this time. The patient does not exercise. She does not currently smoke, and does not currently drink alcohol.  The patient is not checking her sugars at home due to not like needles.    Current Outpatient Prescriptions on File Prior to Visit  Medication Sig Dispense Refill  . Alogliptin Benzoate (NESINA) 25 MG TABS Once daily 30 tablet 3  . aspirin 81 MG tablet Take 81 mg by mouth daily.    Marland Kitchen atorvastatin (LIPITOR) 40 MG tablet TAKE ONE TABLET BY MOUTH ONCE DAILY FOR CHOLESTEROL 30 tablet 0  . fluconazole (DIFLUCAN) 150 MG tablet Take 1 tablet (150 mg total) by mouth once. 1 tablet 3  . glipiZIDE (GLUCOTROL) 5 MG tablet Take 1 tablet (5 mg total) by mouth 2 (two) times daily. 60 tablet 11  . glucose monitoring kit (FREESTYLE) monitoring kit 1 each by Does not apply route as needed for other. Please fill strips 1 qd 100 each 5  . lisinopril (PRINIVIL,ZESTRIL) 10 MG tablet TAKE ONE TABLET BY MOUTH ONCE DAILY FOR BLOOD PRESSURE AND  KIDNEYS 90 tablet 0  . metFORMIN (GLUCOPHAGE-XR) 500 MG 24 hr tablet TAKE TWO TABLETS BY MOUTH TWICE DAILY FOR DIABETES 360 tablet 0  . mometasone (ELOCON) 0.1 % cream APPLY  CREAM TO AFFECTED AREA ONCE DAILY 45 g 1  . ranitidine (ZANTAC) 300 MG tablet Take twice a day while trying to get off PPI, then can go to once at night 60 tablet 3   No current facility-administered medications on file prior to visit.    Past Medical History  Diagnosis Date  . Diabetes mellitus without complication ([place])   . Unspecified vitamin D deficiency    Wt Readings from Last 3  Encounters:  07/09/15 151 lb (68.493 kg)  06/25/15 152 lb (68.947 kg)  05/27/15 154 lb 12.8 oz (70.217 kg)   ROS: no chest pain, dyspnea or TIA's, no numbness, tingling or pain in extremities, no unusual visual symptoms, no hypoglycemia  Physical Exam: Blood pressure 110/82, pulse 62, temperature 97.7 F (36.5 C), temperature source Temporal, resp. rate 16, height 5' 2"  (1.575 m), weight 151 lb (68.493 kg), last menstrual period 06/11/2015, SpO2 93 %. Body mass index is 27.61 kg/(m^2).  Wt Readings from Last 3 Encounters:  07/09/15 151 lb (68.493 kg)  06/25/15 152 lb (68.947 kg)  05/27/15 154 lb 12.8 oz (70.217 kg)   General Appearance:  alert, oriented, no acute distress and obese heart sounds regular rate and rhythm, S1, S2 normal, no murmur, click, rub or gallop, chest clear, no hepatosplenomegaly Foot: normal DP and PT pulses, no trophic changes or ulcerative lesions and normal sensory exam  Labs: Lab Results  Component Value Date   HGBA1C 11.0* 05/27/2015    No results found for: Derl Barrow - will get at next OV Lab Results  Component Value Date   CHOL 186 05/27/2015   HDL 43* 05/27/2015   LDLCALC 91 05/27/2015   TRIG 259* 05/27/2015   CHOLHDL 4.3 05/27/2015     Plan and Assessment: Diabetes Mellitus type 2:  Diabetes mellitus Type II,  under poor control. with poor insight to disease Education: Reviewed 'ABCs' of diabetes management (respective goals in parentheses):  A1C (<7), blood pressure (<130/80), and cholesterol (LDL <100) Discussed injections/referral to endocrine, declines at this time.  Continue janumet 100/1000 with metformin BID, and glipizide 45m Given one touch verio flex and taught to take sugars.  Eye Exam yearly and Dental Exam every 6 months. Dietary recommendations Physical Activity recommendations  Compliance at present is estimated to be poor. Efforts to improve compliance (if necessary) will be directed at dietary modifications:  decrease carbs, will discuss DM referral and close follow up. .   Blood pressure: normal blood pressure.   An ACE/ARB is currently part of their treatment regimen.  LDL goal of < 100, HDL > 40 and TG < 150. Dyslipidemia under fair control. A statin is currently part of their treatment regimen.  Follow up: 8 weeks for 3 month follow up.

## 2015-12-13 ENCOUNTER — Ambulatory Visit: Payer: Self-pay | Admitting: Physician Assistant

## 2016-03-30 ENCOUNTER — Other Ambulatory Visit: Payer: Self-pay | Admitting: Obstetrics and Gynecology

## 2016-03-30 DIAGNOSIS — N6310 Unspecified lump in the right breast, unspecified quadrant: Secondary | ICD-10-CM

## 2016-04-04 ENCOUNTER — Other Ambulatory Visit: Payer: BLUE CROSS/BLUE SHIELD

## 2016-04-05 ENCOUNTER — Ambulatory Visit
Admission: RE | Admit: 2016-04-05 | Discharge: 2016-04-05 | Disposition: A | Discharge status: Home / Self Care | Payer: BLUE CROSS/BLUE SHIELD | Source: Ambulatory Visit | Attending: Obstetrics and Gynecology | Admitting: Obstetrics and Gynecology

## 2016-04-05 DIAGNOSIS — N6310 Unspecified lump in the right breast, unspecified quadrant: Secondary | ICD-10-CM

## 2016-06-26 ENCOUNTER — Other Ambulatory Visit: Payer: Self-pay | Admitting: Physician Assistant

## 2016-08-24 IMAGING — MG 2D DIGITAL DIAGNOSTIC BILATERAL MAMMOGRAM WITH CAD AND ADJUNCT T
8 of 17 series · 8 of 40 positions shown · non-contrast
Comparison: Previous exam(s).

CLINICAL DATA: The patient presented to the referring physician
complaining of a region of skin darkening just lateral to the right
areola. The patient's physician felt a lump in the upper outer right
breast.

EXAM:
2D DIGITAL DIAGNOSTIC BILATERAL MAMMOGRAM WITH CAD AND ADJUNCT TOMO
ULTRASOUND RIGHT BREAST

[R MLO synth-2D]
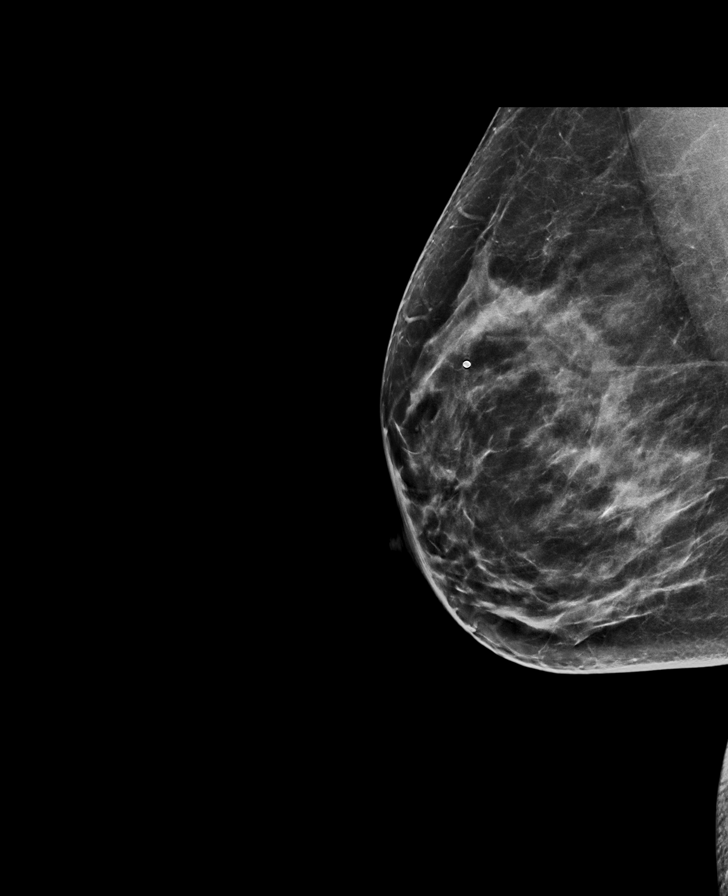

[R MLO (1 of 2)]
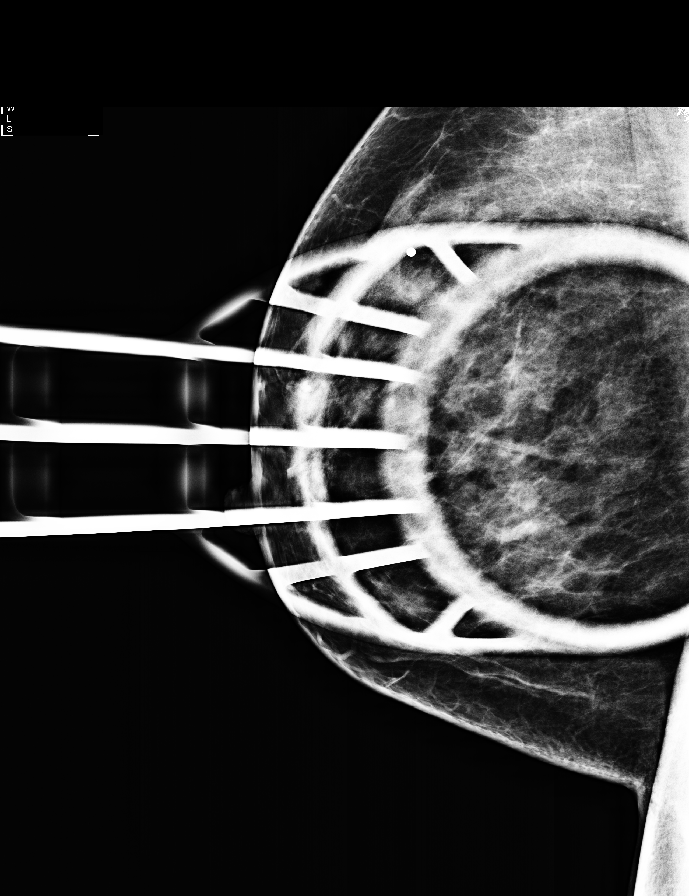

[R CC synth-2D]
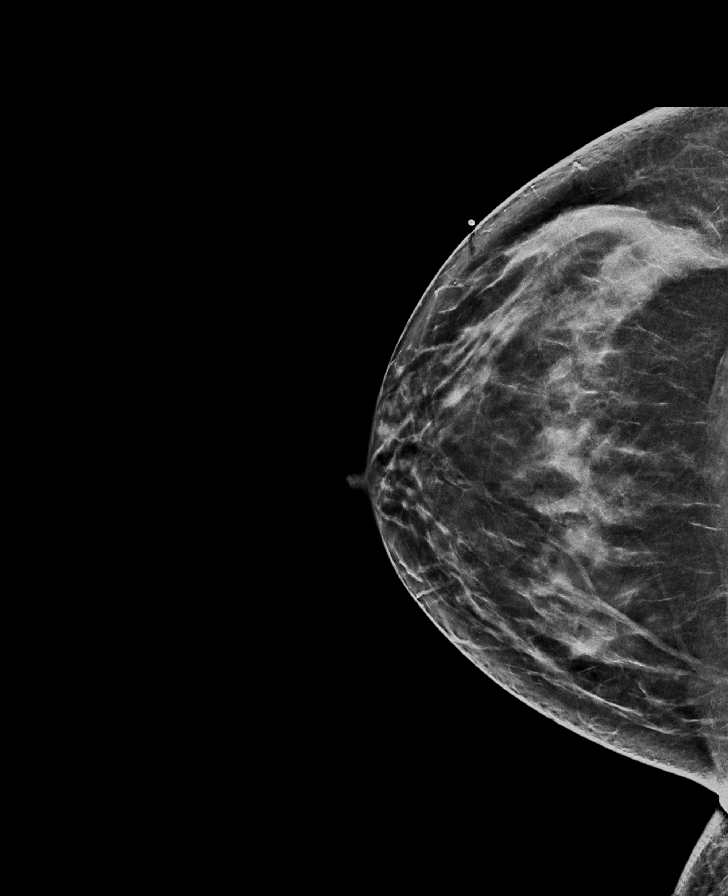

[R MLO (2 of 2)]
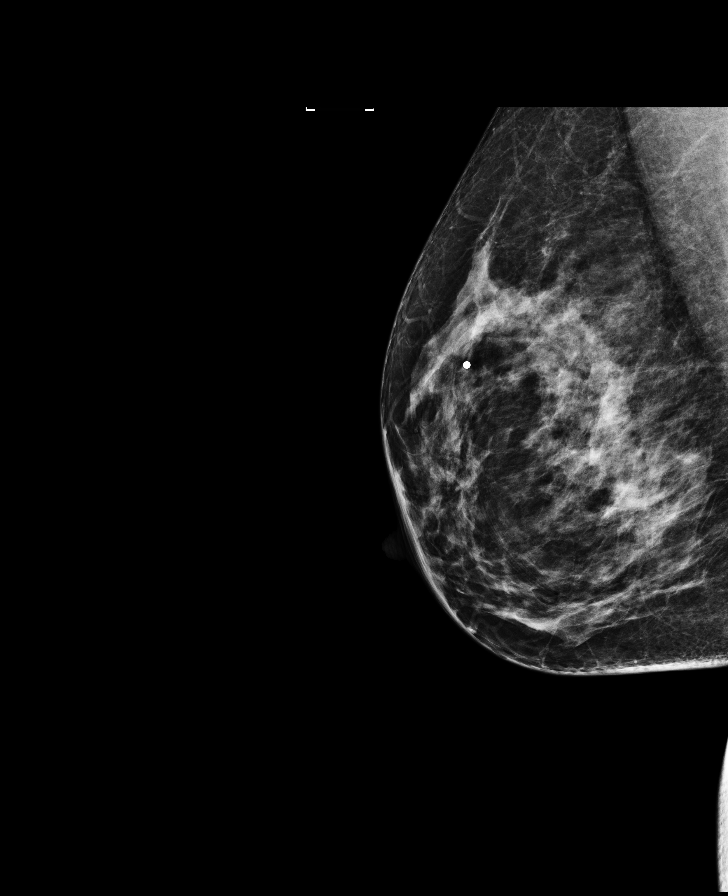

[L CC]
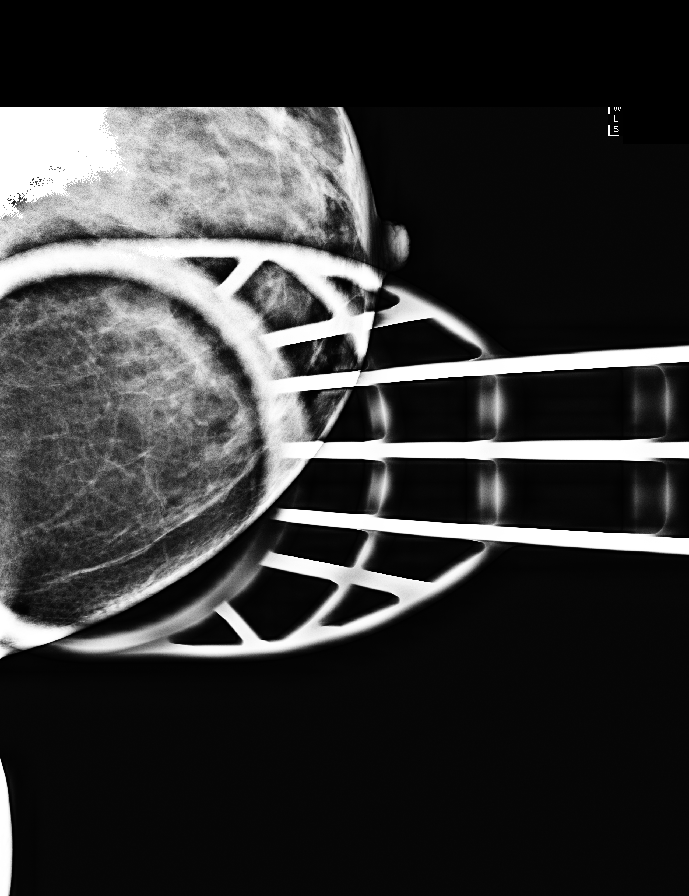

[L CC synth-2D]
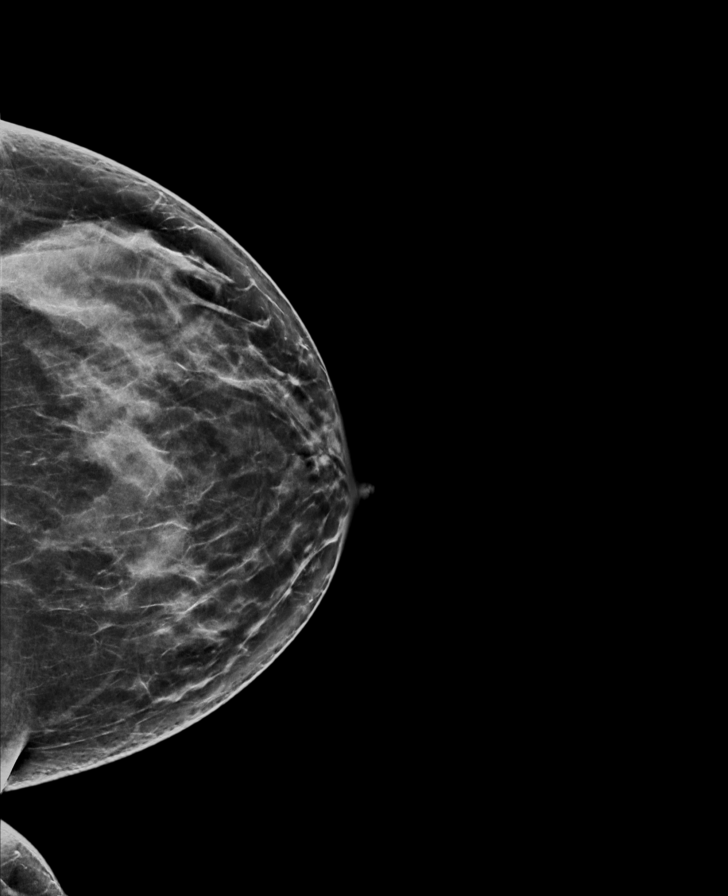

[L MLO synth-2D]
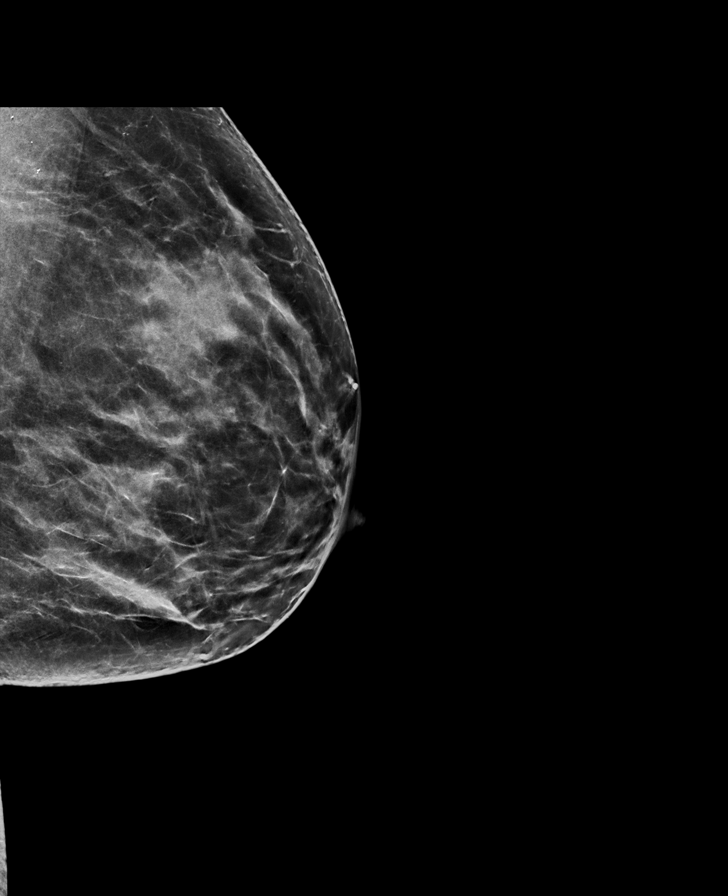

[R CC]
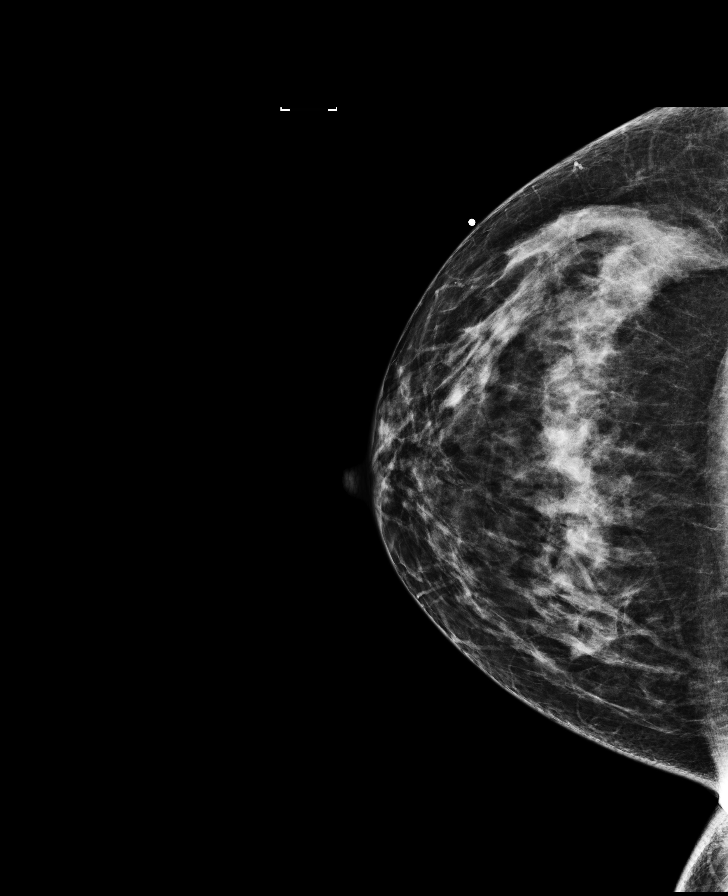

[8 of 40 positions shown; findings below may reference images not displayed]

ACR Breast Density Category c: The breast tissue is heterogeneously
dense, which may obscure small masses.
FINDINGS: No mammographic evidence of malignancy. No suspicious masses,
calcifications, or distortion.

Mammographic images were processed with CAD.

On physical exam, a rounded region of skin darkening and skin
thickening is seen just lateral to the right areola. No other
suspicious physical exam findings.

Targeted ultrasound is performed, showing that the skin finding is
completely located within the skin with no underlying abnormality.
No other suspicious findings in the upper outer right breast.
IMPRESSION: No mammographic or sonographic evidence of malignancy.

RECOMMENDATION:
Treatment of the palpable lump should be based on clinical and
physical exam given the lack of imaging findings. The patient may
benefit from dermatologic consultation for treatment of the skin
lesion.

I have discussed the findings and recommendations with the patient.
Results were also provided in writing at the conclusion of the
visit. If applicable, a reminder letter will be sent to the patient
regarding the next appointment.

BI-RADS CATEGORY  2: Benign.

## 2017-08-17 ENCOUNTER — Other Ambulatory Visit: Payer: Self-pay | Admitting: Physician Assistant

## 2017-08-17 DIAGNOSIS — Z1231 Encounter for screening mammogram for malignant neoplasm of breast: Secondary | ICD-10-CM

## 2017-09-19 ENCOUNTER — Ambulatory Visit
Admission: RE | Admit: 2017-09-19 | Discharge: 2017-09-19 | Disposition: A | Discharge status: Home / Self Care | Payer: BLUE CROSS/BLUE SHIELD | Source: Ambulatory Visit | Attending: Physician Assistant | Admitting: Physician Assistant

## 2017-09-19 DIAGNOSIS — Z1231 Encounter for screening mammogram for malignant neoplasm of breast: Secondary | ICD-10-CM

## 2019-02-24 ENCOUNTER — Other Ambulatory Visit: Payer: Self-pay | Admitting: Obstetrics and Gynecology

## 2019-02-24 DIAGNOSIS — E041 Nontoxic single thyroid nodule: Secondary | ICD-10-CM

## 2019-03-05 ENCOUNTER — Ambulatory Visit
Admission: RE | Admit: 2019-03-05 | Discharge: 2019-03-05 | Disposition: A | Discharge status: Home / Self Care | Payer: BLUE CROSS/BLUE SHIELD | Source: Ambulatory Visit | Attending: Obstetrics and Gynecology | Admitting: Obstetrics and Gynecology

## 2019-03-05 DIAGNOSIS — E041 Nontoxic single thyroid nodule: Secondary | ICD-10-CM

## 2019-03-12 ENCOUNTER — Other Ambulatory Visit: Payer: Self-pay | Admitting: Obstetrics and Gynecology

## 2019-03-12 DIAGNOSIS — E041 Nontoxic single thyroid nodule: Secondary | ICD-10-CM

## 2019-03-19 ENCOUNTER — Ambulatory Visit
Admission: RE | Admit: 2019-03-19 | Discharge: 2019-03-19 | Disposition: A | Discharge status: Home / Self Care | Payer: BC Managed Care – PPO | Source: Ambulatory Visit | Attending: Obstetrics and Gynecology | Admitting: Obstetrics and Gynecology

## 2019-03-19 ENCOUNTER — Other Ambulatory Visit (HOSPITAL_COMMUNITY)
Admission: RE | Admit: 2019-03-19 | Discharge: 2019-03-19 | Disposition: A | Discharge status: Home / Self Care | Payer: BC Managed Care – PPO | Source: Ambulatory Visit | Attending: Interventional Radiology | Admitting: Interventional Radiology

## 2019-03-19 DIAGNOSIS — E041 Nontoxic single thyroid nodule: Secondary | ICD-10-CM

## 2019-07-24 IMAGING — US US THYROID
1 series · 13 of 25 positions shown · non-contrast
Comparison: None.

CLINICAL DATA: Palpable abnormality. Right-sided thyroid nodule
palpated on physical examination.

EXAM:
THYROID ULTRASOUND
TECHNIQUE: Ultrasound examination of the thyroid gland and adjacent soft
tissues was performed.

[Series 1: us thyroid · 0.04mm/px · 13 of 57 slices shown]
[im 1/57]
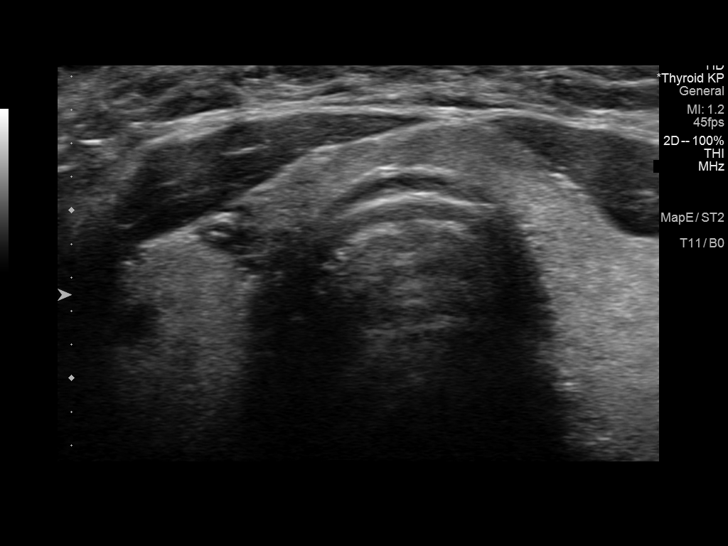
[im 5/57]
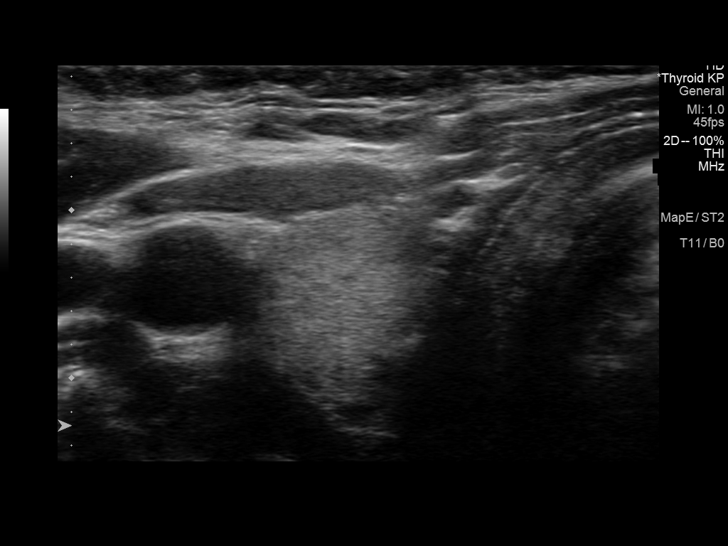
[im 10/57]
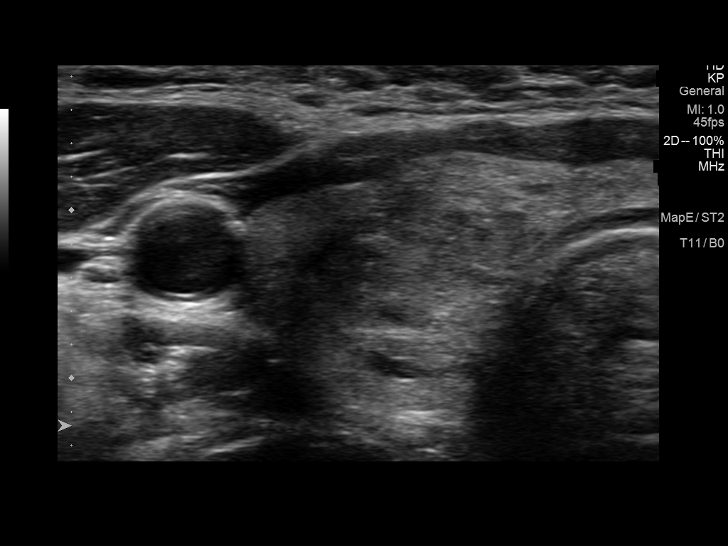
[im 15/57]
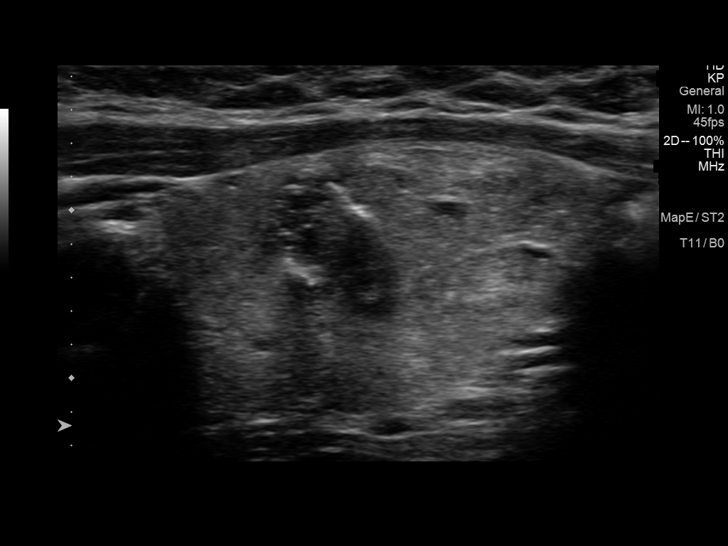
[im 19/57]
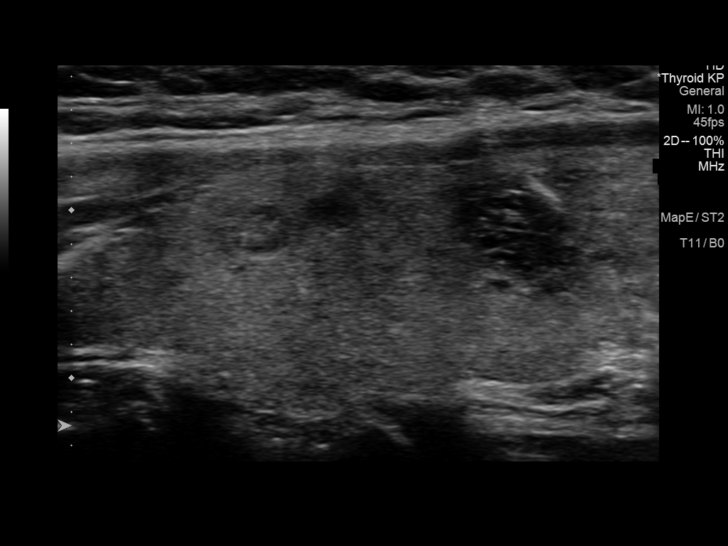
[im 24/57]
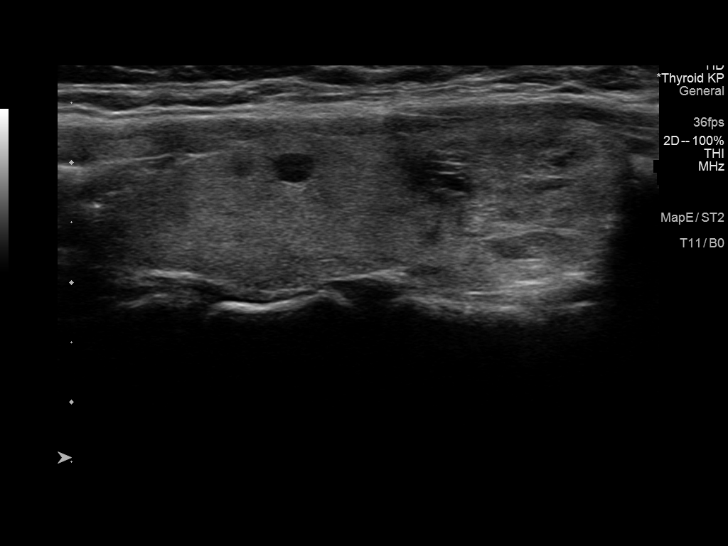
[im 29/57]
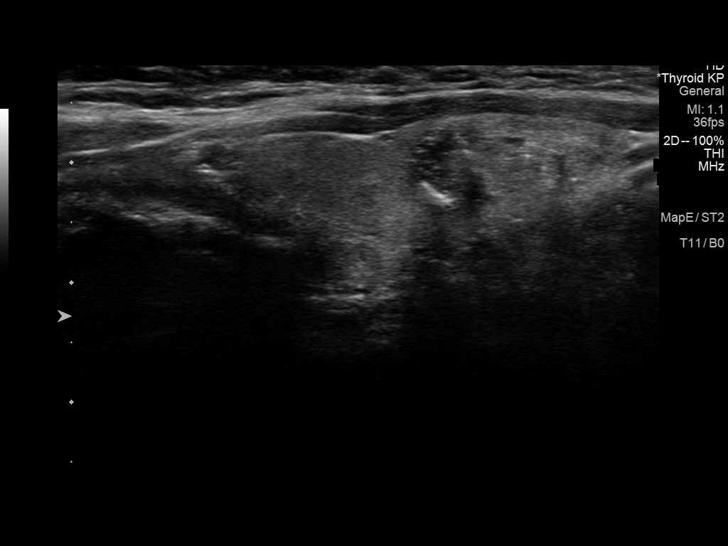
[im 33/57]
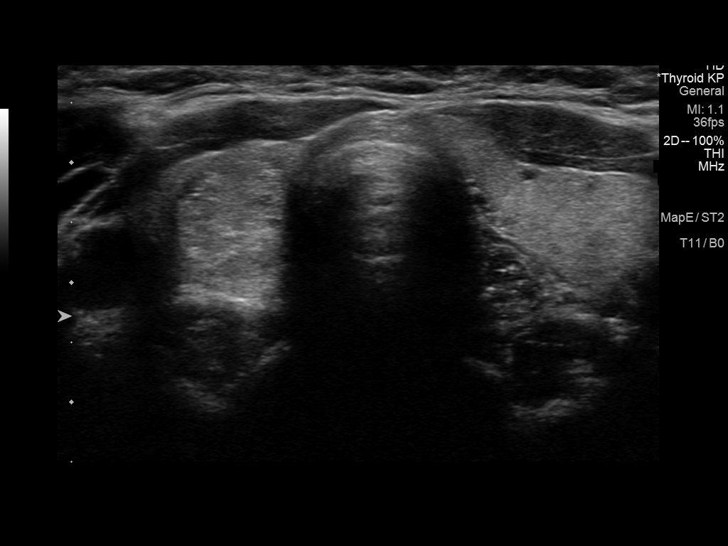
[im 38/57]
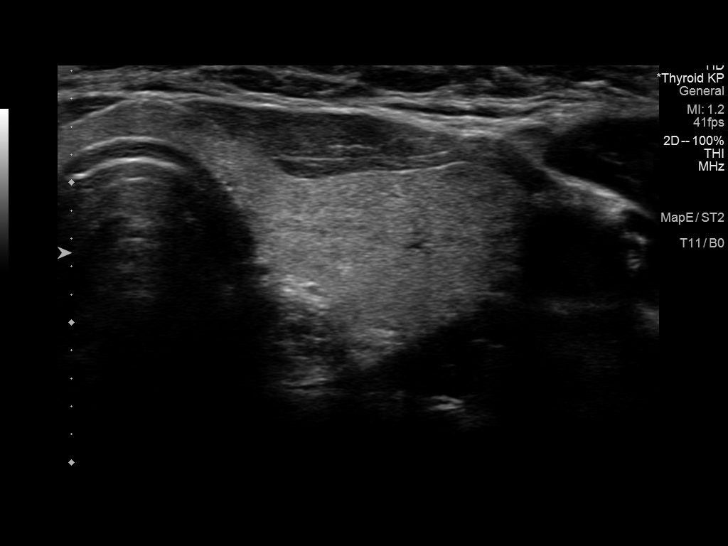
[im 43/57]
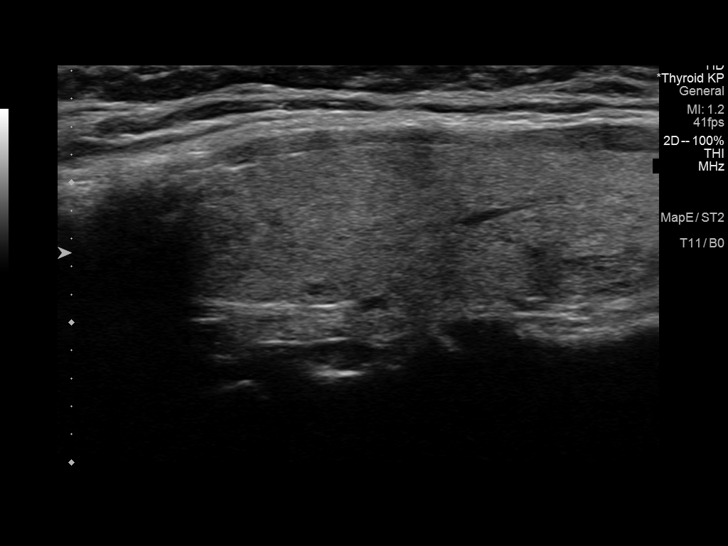
[im 47/57]
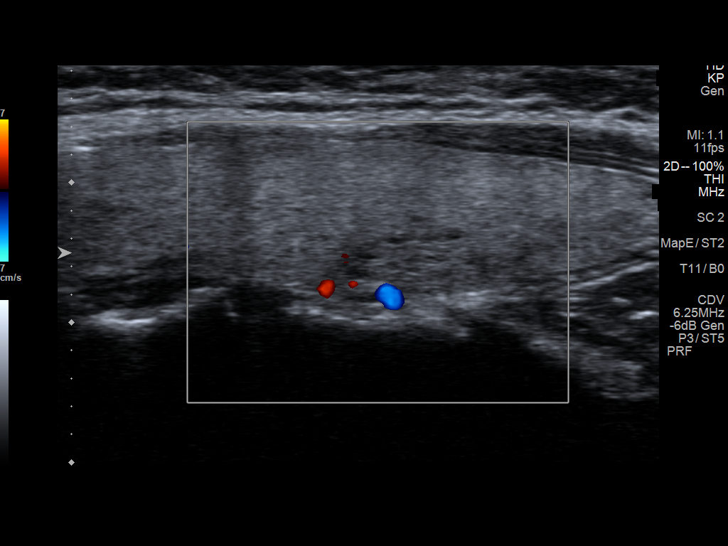
[im 52/57]
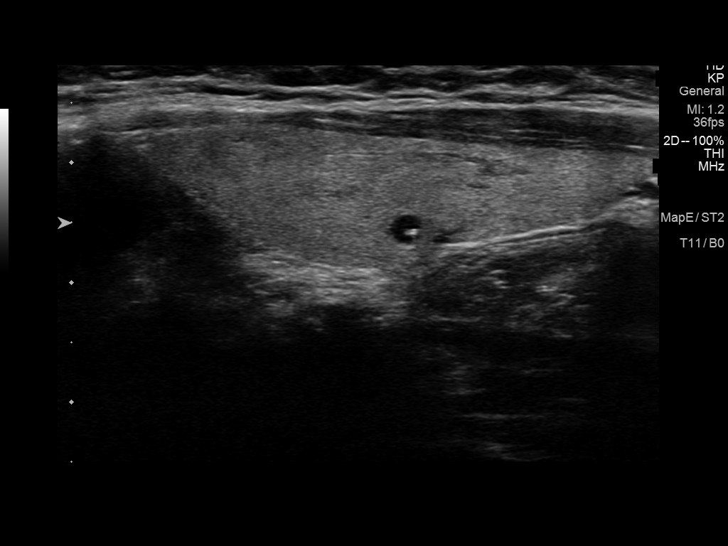
[im 57/57]
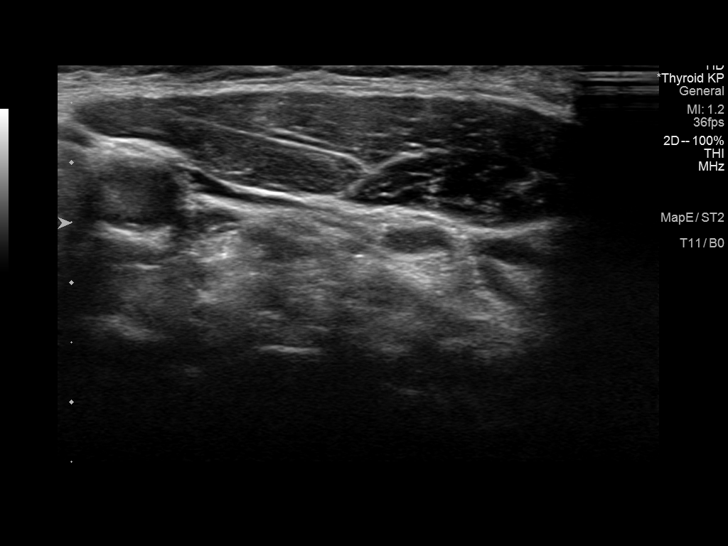

[13 of 25 positions shown; findings below may reference images not displayed]

FINDINGS: Parenchymal Echotexture: Mildly heterogenous

Isthmus: Normal in size measuring 0.3 cm in diameter

Right lobe: Normal in size measuring 4.5 x 1.5 x 1.7 cm

Left lobe: Savitri in size measuring 5.0 x 1.1 x 1.9 cm

_________________________________________________________

Estimated total number of nodules >/= 1 cm: 1

Number of spongiform nodules >/=  2 cm not described below (TR1): 0

Number of mixed cystic and solid nodules >/= 1.5 cm not described
below (TR2): 0

_________________________________________________________

Nodule # 1:

Location: Right; Mid

Maximum size: 2.1 cm; Other 2 dimensions: 1.4 x 1.2 cm

Composition: solid/almost completely solid (2)

Echogenicity: isoechoic (1)

Shape: not taller-than-wide (0)

Margins: ill-defined (0)

Echogenic foci: macrocalcifications (1)

ACR TI-RADS total points: 4.

ACR TI-RADS risk category: TR4 (4-6 points).

ACR TI-RADS recommendations:

**Given size (>/= 1.5 cm) and appearance, fine needle aspiration of
this moderately suspicious nodule should be considered based on
TI-RADS criteria.

_________________________________________________________

There is a punctate (approximately 0.3 cm) anechoic cyst within mid
aspect of the right lobe of the thyroid which does not meet imaging
criteria to recommend percutaneous sampling or continued dedicated
follow-up.

There is a punctate (approximately 0.3 cm) anechoic cyst within mid
aspect of the left lobe of the thyroid which contains an internal
echogenic foci ring down artifact compatible with benign colloid.

Questioned approximately 1.1 cm isoechoic ill-defined nodule within
mid, posterior aspect of the left lobe of the thyroid is favored to
represent a pseudonodule as it lacks defined borders on both
provided longitudinal and transverse images.
IMPRESSION: 1. Findings of suggestive multinodular goiter.
2. Nodule #1 within the right lobe of the thyroid, likely
correlating with the patient's palpable area of concern, meets
imaging criteria to recommend percutaneous sampling as clinically
indicated.

The above is in keeping with the ACR TI-RADS recommendations - [HOSPITAL] 9775;[DATE].

## 2019-11-20 ENCOUNTER — Encounter: Payer: Self-pay | Admitting: Gastroenterology

## 2019-11-26 ENCOUNTER — Ambulatory Visit (AMBULATORY_SURGERY_CENTER): Payer: Self-pay | Admitting: *Deleted

## 2019-11-26 ENCOUNTER — Other Ambulatory Visit: Payer: Self-pay

## 2019-11-26 VITALS — Temp 97.1°F | Ht 62.0 in | Wt 147.6 lb

## 2019-11-26 DIAGNOSIS — Z1211 Encounter for screening for malignant neoplasm of colon: Secondary | ICD-10-CM

## 2019-11-26 DIAGNOSIS — Z01818 Encounter for other preprocedural examination: Secondary | ICD-10-CM

## 2019-11-26 MED ORDER — SUPREP BOWEL PREP KIT 17.5-3.13-1.6 GM/177ML PO SOLN: ORAL | 0 refills | Status: DC

## 2019-11-26 NOTE — Progress Notes (Signed)
No anesthesia problems, trouble moving neck or hx/fam hx of malignant hyperthermia  Pt is aware that care partner will wait in the car during procedure; if they feel like they will be too hot or cold to wait in the car; they may wait in the 4 th floor lobby. Patient is aware to bring only one care partner. We want them to wear a mask (we do not have any that we can provide them), practice social distancing, and we will check their temperatures when they get here.  I did remind the patient that their care partner needs to stay in the parking lot the entire time and have a cell phone available, we will call them when the pt is ready for discharge. Patient will wear mask into building.   No egg or soy allergy  No home oxygen use   No medications for weight loss taken  emmi information given  covid test 12-05-19 at 1120  Suprep coupon given and code put into RX

## 2019-12-05 ENCOUNTER — Other Ambulatory Visit: Payer: Self-pay | Admitting: Gastroenterology

## 2019-12-05 ENCOUNTER — Ambulatory Visit (INDEPENDENT_AMBULATORY_CARE_PROVIDER_SITE_OTHER): Payer: BC Managed Care – PPO

## 2019-12-05 DIAGNOSIS — Z1159 Encounter for screening for other viral diseases: Secondary | ICD-10-CM

## 2019-12-06 LAB — SARS CORONAVIRUS 2 (TAT 6-24 HRS): SARS Coronavirus 2: NEGATIVE

## 2019-12-10 ENCOUNTER — Ambulatory Visit (AMBULATORY_SURGERY_CENTER): Payer: BC Managed Care – PPO | Admitting: Gastroenterology

## 2019-12-10 ENCOUNTER — Encounter: Payer: Self-pay | Admitting: Gastroenterology

## 2019-12-10 ENCOUNTER — Other Ambulatory Visit: Payer: Self-pay

## 2019-12-10 VITALS — BP 130/78 | HR 72 | Temp 96.9°F | Resp 13 | Ht 62.0 in | Wt 147.0 lb

## 2019-12-10 DIAGNOSIS — Z86010 Personal history of colonic polyps: Secondary | ICD-10-CM

## 2019-12-10 DIAGNOSIS — Z1211 Encounter for screening for malignant neoplasm of colon: Secondary | ICD-10-CM

## 2019-12-10 MED ORDER — SODIUM CHLORIDE 0.9 % IV SOLN: 500.0000 mL | Freq: Once | INTRAVENOUS | Status: DC

## 2019-12-10 NOTE — Patient Instructions (Signed)
Handouts for hemorrhoids and high fiber diet given.  YOU HAD AN ENDOSCOPIC PROCEDURE TODAY AT THE [facility]:   Refer to the procedure report that was given to you for any specific questions about what was found during the examination.  If the procedure report does not answer your questions, please call your gastroenterologist to clarify.  If you requested that your care partner not be given the details of your procedure findings, then the procedure report has been included in a sealed envelope for you to review at your convenience later.  YOU SHOULD EXPECT: Some feelings of bloating in the abdomen. Passage of more gas than usual.  Walking can help get rid of the air that was put into your GI tract during the procedure and reduce the bloating. If you had a lower endoscopy (such as a colonoscopy or flexible sigmoidoscopy) you may notice spotting of blood in your stool or on the toilet paper. If you underwent a bowel prep for your procedure, you may not have a normal bowel movement for a few days.  Please Note:  You might notice some irritation and congestion in your nose or some drainage.  This is from the oxygen used during your procedure.  There is no need for concern and it should clear up in a day or so.  SYMPTOMS TO REPORT IMMEDIATELY:   Following lower endoscopy (colonoscopy or flexible sigmoidoscopy):  Excessive amounts of blood in the stool  Significant tenderness or worsening of abdominal pains  Swelling of the abdomen that is new, acute  Fever of 100F or higher  For urgent or emergent issues, a gastroenterologist can be reached at any hour by calling [phone]. Do not use MyChart messaging for urgent concerns.    DIET:  We do recommend a small meal at first, but then you may proceed to your regular diet.  Drink plenty of fluids but you should avoid alcoholic beverages for 24 hours.  ACTIVITY:  You should plan to take it easy for the rest of today and you should NOT  DRIVE or use heavy machinery until tomorrow (because of the sedation medicines used during the test).    FOLLOW UP: Our staff will call the number listed on your records 48-72 hours following your procedure to check on you and address any questions or concerns that you may have regarding the information given to you following your procedure. If we do not reach you, we will leave a message.  We will attempt to reach you two times.  During this call, we will ask if you have developed any symptoms of COVID 19. If you develop any symptoms (ie: fever, flu-like symptoms, shortness of breath, cough etc.) before then, please call [phone].  If you test positive for Covid 19 in the 2 weeks post procedure, please call and report this information to [place].    If any biopsies were taken you will be contacted by phone or by letter within the next 1-3 weeks.  Please call us at [phone] if you have not heard about the biopsies in 3 weeks.    SIGNATURES/CONFIDENTIALITY: You and/or your care partner have signed paperwork which will be entered into your electronic medical record.  These signatures attest to the fact that that the information above on your After Visit Summary has been reviewed and is understood.  Full responsibility of the confidentiality of this discharge information lies with you and/or your care-partner.

## 2019-12-10 NOTE — Progress Notes (Signed)
Temp check by:JB Vital check by:KA  The patient states no changes in medical or surgical history since pre-visit screening on 11/26/2019.  

## 2019-12-10 NOTE — Progress Notes (Signed)
Pt was sleepy for about 20 mins, vss, wakes without c/o passing flatus, no problems.  Up to bathroom without dizziness, no c/o voiced.

## 2019-12-10 NOTE — Op Note (Signed)
[facility] Patient Name: Joanna Marsh Procedure Date: 12/10/2019 11:07 AM MRN: [id number] Endoscopist: Tressia Danas MD, MD Age: 49 Referring MD:  Date of Birth: [birth year] Gender: Female Account #: [id number] Procedure:                Colonoscopy Indications:              Screening for colon cancer: Family history of colon                            polyps in distant relative(s) before age 33                           Mother with colon polyps                           No known family history of colon cancer Medicines:                Monitored Anesthesia Care Procedure:                Pre-Anesthesia Assessment:                           - Prior to the procedure, a History and Physical                            was performed, and patient medications and                            allergies were reviewed. The patient's tolerance of                            previous anesthesia was also reviewed. The risks                            and benefits of the procedure and the sedation                            options and risks were discussed with the patient.                            All questions were answered, and informed consent                            was obtained. Prior Anticoagulants: The patient has                            taken no previous anticoagulant or antiplatelet                            agents. ASA Grade Assessment: II - A patient with                            mild systemic disease. After reviewing the risks  and benefits, the patient was deemed in                            satisfactory condition to undergo the procedure.                           After obtaining informed consent, the colonoscope                            was passed under direct vision. Throughout the                            procedure, the patient's blood pressure, pulse, and                            oxygen saturations were monitored continuously. The                             Colonoscope was introduced through the anus and                            advanced to the 3 cm into the ileum. A second                            forward view of the right colon was performed. The                            colonoscopy was performed without difficulty. The                            patient tolerated the procedure well. The quality                            of the bowel preparation was good. The terminal                            ileum, ileocecal valve, appendiceal orifice, and                            rectum were photographed. Scope In: 11:12:08 AM Scope Out: 11:22:46 AM Scope Withdrawal Time: 0 hours 8 minutes 47 seconds  Total Procedure Duration: 0 hours 10 minutes 38 seconds  Findings:                 The perianal and digital rectal examinations were                            normal.                           Non-bleeding internal hemorrhoids were found. The                            hemorrhoids were small.  The exam was otherwise without abnormality on                            direct and retroflexion views. Complications:            No immediate complications. Estimated Blood Loss:     Estimated blood loss: none. Impression:               - Non-bleeding internal hemorrhoids.                           - The examination was otherwise normal on direct                            and retroflexion views.                           - No specimens collected. Recommendation:           - Patient has a contact number available for                            emergencies. The signs and symptoms of potential                            delayed complications were discussed with the                            patient. Return to normal activities tomorrow.                            Written discharge instructions were provided to the                            patient.                           - Resume previous diet.                            - Continue present medications.                           - Repeat colonoscopy in 5 years for screening                            purposes given the family history.                           - Emerging evidence supports eating a diet of                            fruits, vegetables, grains, calcium, and yogurt                            while reducing red meat and alcohol may reduce the  risk of colon cancer.                           - Thank you for allowing me to be involved in your                            colon cancer prevention. Tressia Danas MD, MD 12/10/2019 11:27:38 AM This report has been signed electronically. Lelon Perla,

## 2019-12-10 NOTE — Progress Notes (Signed)
Report given to PACU, vss 

## 2019-12-12 ENCOUNTER — Telehealth: Payer: Self-pay | Admitting: Gastroenterology

## 2019-12-12 ENCOUNTER — Telehealth: Payer: Self-pay

## 2019-12-12 NOTE — Telephone Encounter (Signed)
  Follow up Call-  Call back number 12/10/2019  Post procedure Call Back phone  # [phone]  Permission to leave phone message Yes  Some recent data might be hidden     Patient questions:  Do you have a fever, pain , or abdominal swelling? No. Pain Score  0 *  Have you tolerated food without any problems? Yes.    Have you been able to return to your normal activities? Yes.    Do you have any questions about your discharge instructions: Diet   No. Medications  No. Follow up visit  No.  Do you have questions or concerns about your Care? No.  Actions: * If pain score is 4 or above: No action needed, pain <4.  1. Have you developed a fever since your procedure? No  2.   Have you had an respiratory symptoms (SOB or cough) since your procedure? No  3.   Have you tested positive for COVID 19 since your procedure No  4.   Have you had any family members/close contacts diagnosed with the COVID 19 since your procedure?  No   If yes to any of these questions please route to Laverna Peace, RN and Charlett Lango, RN

## 2019-12-12 NOTE — Telephone Encounter (Signed)
Patient reports that she is spotting vaginally and wanted to know if it has anything to do with the procedure.  She is advised that this should have nothing to do with the procedure.  She will call back for any GI complaints

## 2021-01-08 IMAGING — US ULTRASOUND FNA BIOPSY THYROID 1ST LESION
1 series · 13 of 16 positions shown · non-contrast
Comparison: 03/05/2019

MEDICATIONS:
Lidocaine 1% subcutaneous

COMPLICATIONS:
None immediate.

INDICATION: Indeterminate thyroid nodule

EXAM:
ULTRASOUND GUIDED FINE NEEDLE ASPIRATION OF INDETERMINATE THYROID
NODULE
TECHNIQUE: Informed written consent was obtained from the patient after a
discussion of the risks, benefits and alternatives to treatment.
Questions regarding the procedure were encouraged and answered. A
timeout was performed prior to the initiation of the procedure.

[Series 1: ultrasound fna biopsy thyroid 1st lesion · 0.04mm/px · 16 acquisitions, 13 frames shown]
[im 1/16]
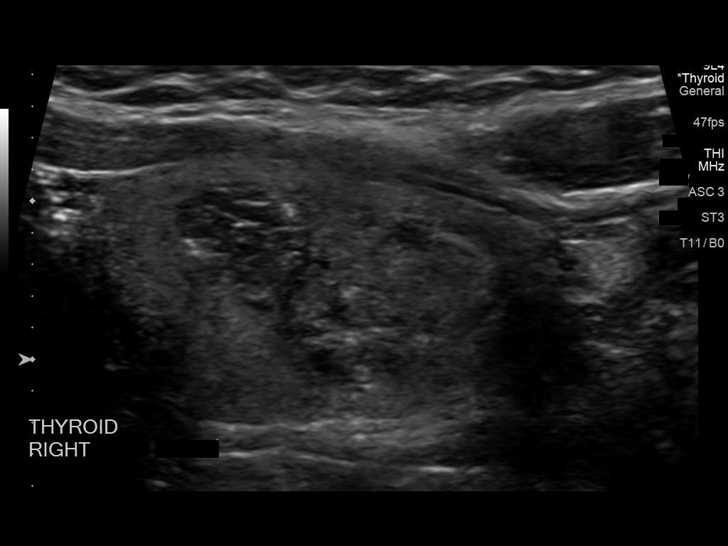
[im 2/16]
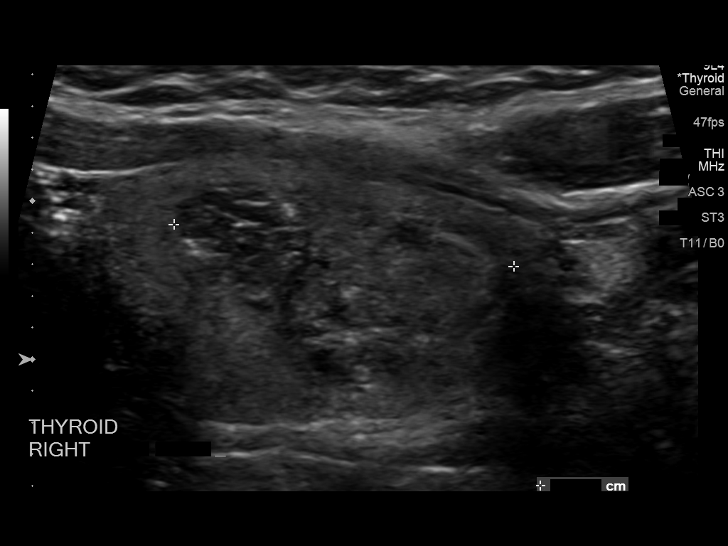
[im 4/16]
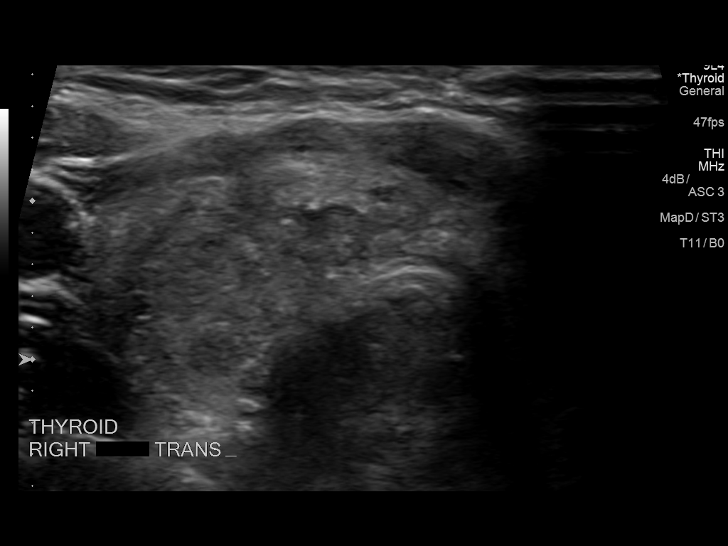
[im 5/16]
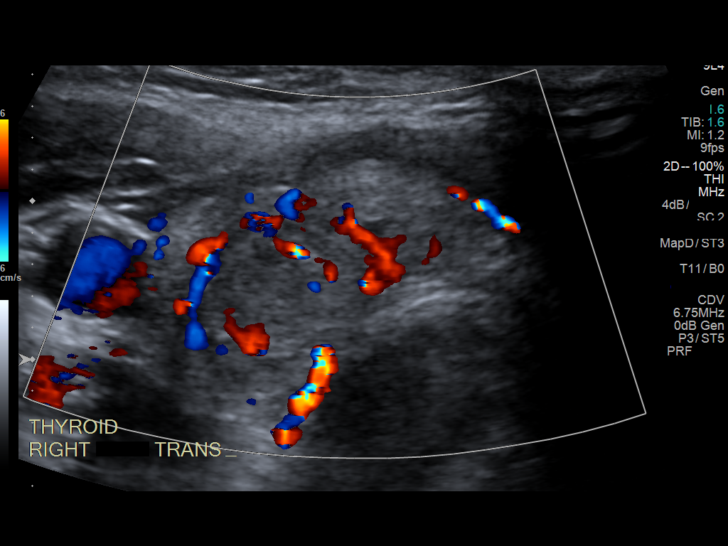
[im 6/16]
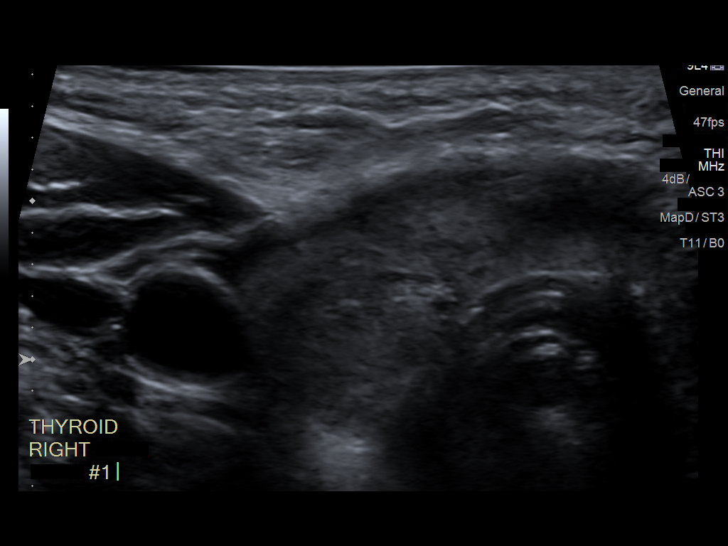
[im 7/16]
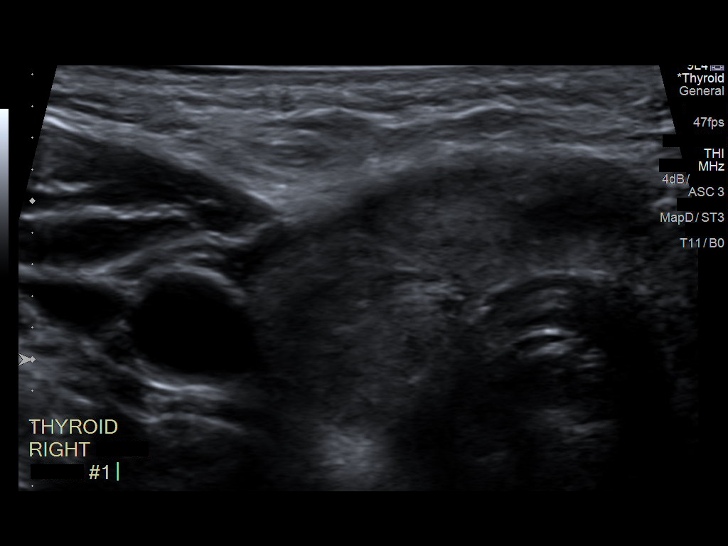
[im 9/16]
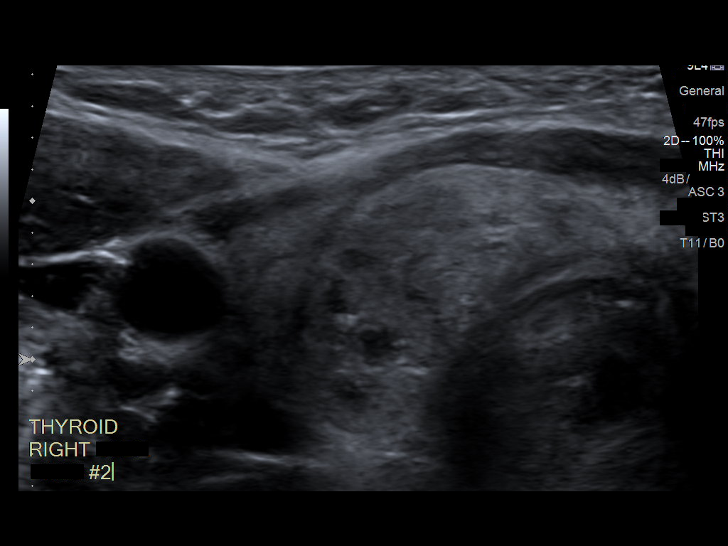
[im 10/16]
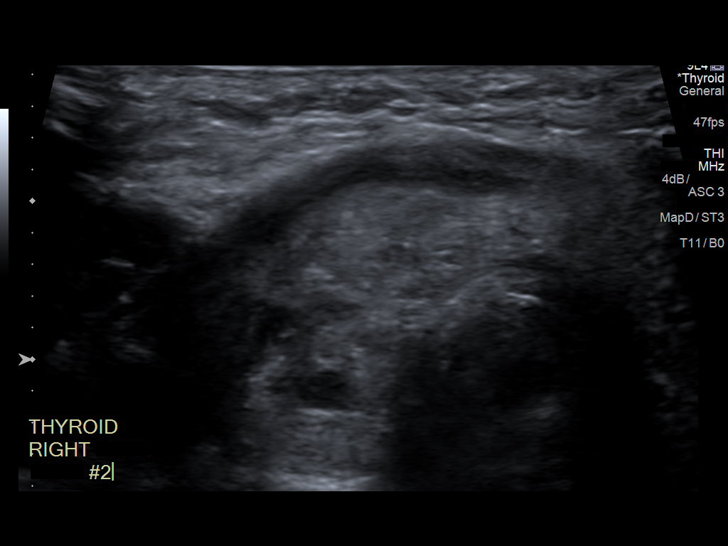
[im 11/16]
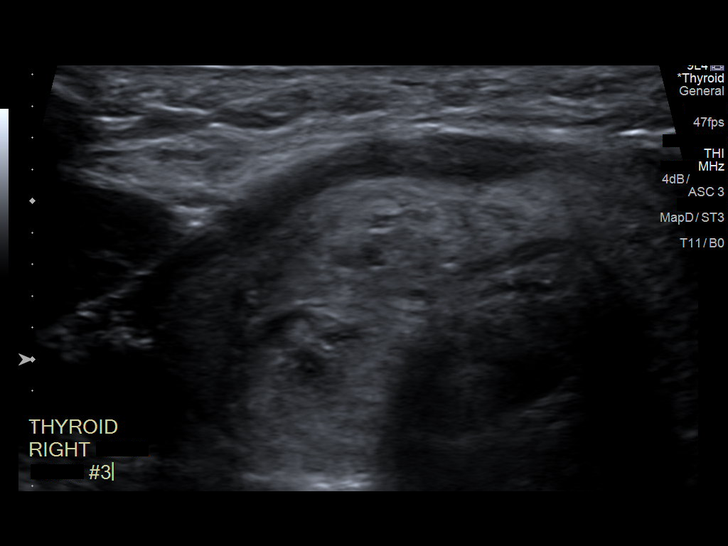
[im 12/16]
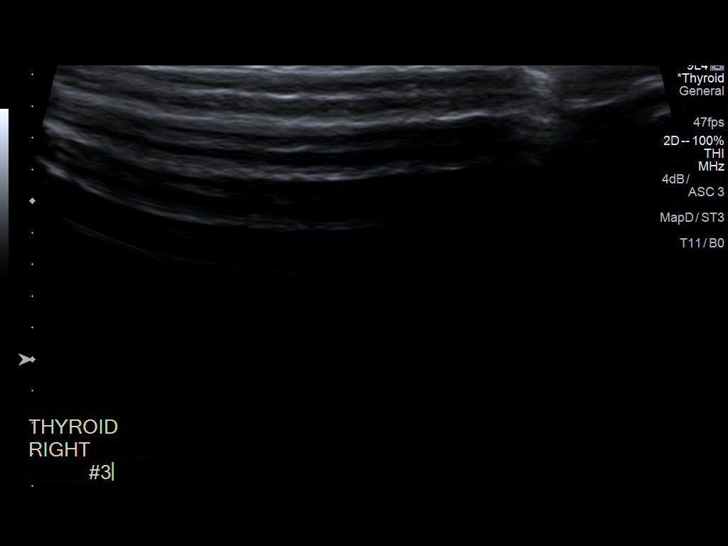
[im 13/16]
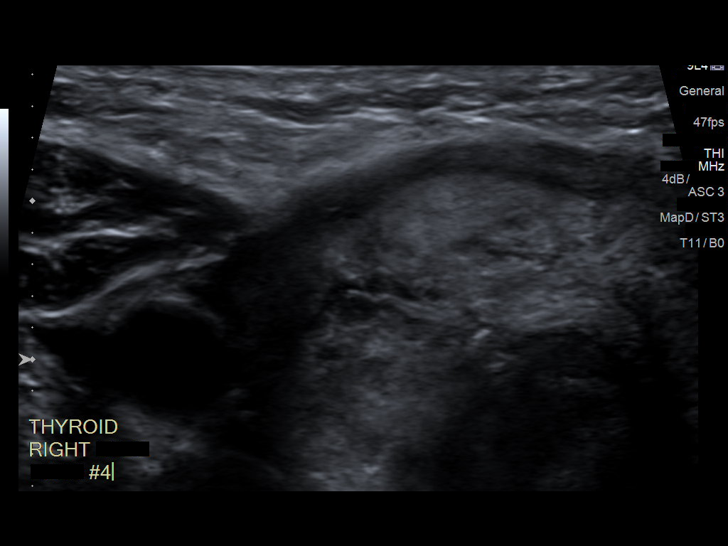
[im 15/16]
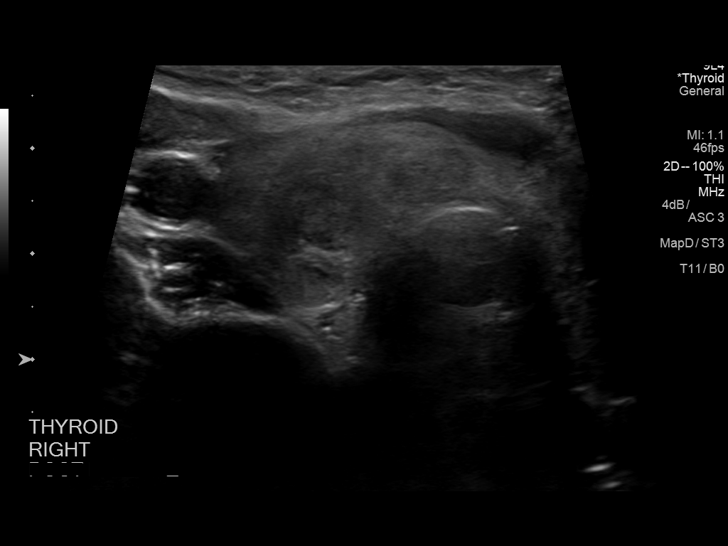
[im 16/16]
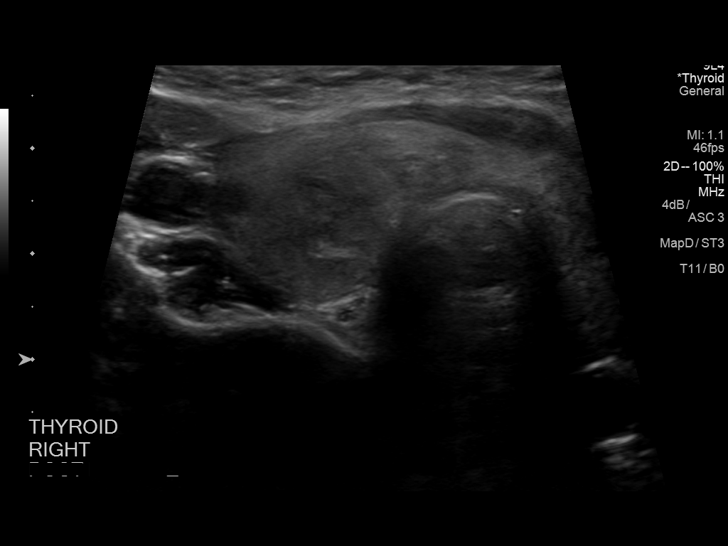

[13 of 16 positions shown; findings below may reference images not displayed]

Pre-procedural ultrasound scanning demonstrated unchanged size and
appearance of the indeterminate nodule within the mid right lobe.

The procedure was planned. The neck was prepped in the usual sterile
fashion, and a sterile drape was applied covering the operative
field. A timeout was performed prior to the initiation of the
procedure. Local anesthesia was provided with 1% lidocaine.

Under direct ultrasound guidance, 4 FNA biopsies were performed of
the mid right nodule with a 25 gauge needle. Multiple ultrasound
images were saved for procedural documentation purposes. The samples
were prepared and submitted to pathology.

Limited post procedural scanning was negative for hematoma or
additional complication. Dressings were placed. The patient
tolerated the above procedures procedure well without immediate
postprocedural complication.
FINDINGS: Nodule reference number based on prior diagnostic ultrasound: 1

Maximum size: 2.1 cm

Location: Right; Mid

ACR TI-RADS risk category: TR4 (4-6 points)

Reason for biopsy: meets ACR TI-RADS criteria

Ultrasound imaging confirms appropriate placement of the needles
within the thyroid nodule.
IMPRESSION: Technically successful ultrasound guided fine needle aspiration of
mid right TR4 2.1 cm thyroid nodule.

## 2023-02-25 ENCOUNTER — Encounter (HOSPITAL_COMMUNITY): Payer: Self-pay | Admitting: *Deleted

## 2023-02-25 ENCOUNTER — Other Ambulatory Visit: Payer: Self-pay

## 2023-02-25 ENCOUNTER — Emergency Department (HOSPITAL_COMMUNITY): Payer: Self-pay

## 2023-02-25 ENCOUNTER — Emergency Department (HOSPITAL_COMMUNITY)
Admission: EM | Admit: 2023-02-25 | Discharge: 2023-02-25 | Disposition: A | Discharge status: Home / Self Care | Payer: Self-pay | Attending: Emergency Medicine | Admitting: Emergency Medicine

## 2023-02-25 DIAGNOSIS — R739 Hyperglycemia, unspecified: Secondary | ICD-10-CM

## 2023-02-25 DIAGNOSIS — R1084 Generalized abdominal pain: Secondary | ICD-10-CM

## 2023-02-25 DIAGNOSIS — E860 Dehydration: Secondary | ICD-10-CM | POA: Insufficient documentation

## 2023-02-25 DIAGNOSIS — E1165 Type 2 diabetes mellitus with hyperglycemia: Secondary | ICD-10-CM | POA: Insufficient documentation

## 2023-02-25 DIAGNOSIS — I10 Essential (primary) hypertension: Secondary | ICD-10-CM | POA: Insufficient documentation

## 2023-02-25 LAB — URINALYSIS, ROUTINE W REFLEX MICROSCOPIC
Bilirubin Urine: NEGATIVE
Glucose, UA: >=500 mg/dL — AB
Hgb urine dipstick: NEGATIVE
Ketones, ur: 5 mg/dL — AB
Leukocytes,Ua: NEGATIVE
Nitrite: POSITIVE — AB
Protein, ur: NEGATIVE mg/dL
Specific Gravity, Urine: 1.027 (ref 1.005–1.030)
pH: 5 (ref 5.0–8.0)

## 2023-02-25 LAB — COMPREHENSIVE METABOLIC PANEL
ALT: 28 U/L (ref 0–44)
AST: 19 U/L (ref 15–41)
Albumin: 4.1 g/dL (ref 3.5–5.0)
Alkaline Phosphatase: 77 U/L (ref 38–126)
Anion gap: 15 (ref 5–15)
BUN: 34 mg/dL — ABNORMAL HIGH (ref 6–20)
CO2: 29 mmol/L (ref 22–32)
Calcium: 10 mg/dL (ref 8.9–10.3)
Chloride: 86 mmol/L — ABNORMAL LOW (ref 98–111)
Creatinine, Ser: 1.3 mg/dL — ABNORMAL HIGH (ref 0.44–1.00)
GFR, Estimated: 50 mL/min — ABNORMAL LOW (ref >60)
Glucose, Bld: 476 mg/dL — ABNORMAL HIGH (ref 70–99)
Potassium: 4.6 mmol/L (ref 3.5–5.1)
Sodium: 130 mmol/L — ABNORMAL LOW (ref 135–145)
Total Bilirubin: 1 mg/dL (ref 0.3–1.2)
Total Protein: 7.9 g/dL (ref 6.5–8.1)

## 2023-02-25 LAB — I-STAT VENOUS BLOOD GAS, ED
Acid-Base Excess: 6 mmol/L — ABNORMAL HIGH (ref 0.0–2.0)
Bicarbonate: 33.4 mmol/L — ABNORMAL HIGH (ref 20.0–28.0)
Calcium, Ion: 1.19 mmol/L (ref 1.15–1.40)
HCT: 42 % (ref 36.0–46.0)
Hemoglobin: 14.3 g/dL (ref 12.0–15.0)
O2 Saturation: 74 %
Potassium: 4.7 mmol/L (ref 3.5–5.1)
Sodium: 130 mmol/L — ABNORMAL LOW (ref 135–145)
TCO2: 35 mmol/L — ABNORMAL HIGH (ref 22–32)
pCO2, Ven: 55.9 mmHg (ref 44–60)
pH, Ven: 7.384 (ref 7.25–7.43)
pO2, Ven: 41 mmHg (ref 32–45)

## 2023-02-25 LAB — LIPASE, BLOOD: Lipase: 32 U/L (ref 11–51)

## 2023-02-25 LAB — CBC
HCT: 39.8 % (ref 36.0–46.0)
Hemoglobin: 13 g/dL (ref 12.0–15.0)
MCH: 25.3 pg — ABNORMAL LOW (ref 26.0–34.0)
MCHC: 32.7 g/dL (ref 30.0–36.0)
MCV: 77.6 fL — ABNORMAL LOW (ref 80.0–100.0)
Platelets: 294 10*3/uL (ref 150–400)
RBC: 5.13 MIL/uL — ABNORMAL HIGH (ref 3.87–5.11)
RDW: 12 % (ref 11.5–15.5)
WBC: 6.5 10*3/uL (ref 4.0–10.5)
nRBC: 0 % (ref 0.0–0.2)

## 2023-02-25 LAB — I-STAT BETA HCG BLOOD, ED (MC, WL, AP ONLY): I-stat hCG, quantitative: <5 m[IU]/mL (ref <5)

## 2023-02-25 LAB — CBG MONITORING, ED
Glucose-Capillary: 458 mg/dL — ABNORMAL HIGH (ref 70–99)
Glucose-Capillary: 285 mg/dL — ABNORMAL HIGH (ref 70–99)

## 2023-02-25 MED ORDER — FAMOTIDINE IN NACL 20-0.9 MG/50ML-% IV SOLN
20.0000 mg | Freq: Once | INTRAVENOUS | Status: AC
  Administered 2023-02-25: 20 mg via INTRAVENOUS
  Filled 2023-02-25: qty 50

## 2023-02-25 MED ORDER — METOCLOPRAMIDE HCL 5 MG/ML IJ SOLN
10.0000 mg | Freq: Once | INTRAMUSCULAR | Status: AC
  Administered 2023-02-25: 10 mg via INTRAVENOUS
  Filled 2023-02-25: qty 2

## 2023-02-25 MED ORDER — INSULIN ASPART 100 UNIT/ML IJ SOLN
10.0000 [IU] | Freq: Once | INTRAMUSCULAR | Status: AC
  Administered 2023-02-25: 10 [IU] via SUBCUTANEOUS

## 2023-02-25 MED ORDER — DIPHENHYDRAMINE HCL 50 MG/ML IJ SOLN
25.0000 mg | Freq: Once | INTRAMUSCULAR | Status: AC
  Administered 2023-02-25: 25 mg via INTRAVENOUS
  Filled 2023-02-25: qty 1

## 2023-02-25 MED ORDER — IOHEXOL 350 MG/ML SOLN
75.0000 mL | Freq: Once | INTRAVENOUS | Status: AC | PRN
  Administered 2023-02-25: 75 mL via INTRAVENOUS

## 2023-02-25 MED ORDER — CEPHALEXIN 500 MG PO CAPS: 500.0000 mg | ORAL_CAPSULE | Freq: Two times a day (BID) | ORAL | 0 refills | Status: AC

## 2023-02-25 MED ORDER — SODIUM CHLORIDE 0.9 % IV SOLN
1.0000 g | Freq: Once | INTRAVENOUS | Status: AC
  Administered 2023-02-25: 1 g via INTRAVENOUS
  Filled 2023-02-25: qty 10

## 2023-02-25 MED ORDER — METOCLOPRAMIDE HCL 10 MG PO TABS: 10.0000 mg | ORAL_TABLET | Freq: Four times a day (QID) | ORAL | 0 refills | Status: AC | PRN

## 2023-02-25 MED ORDER — LACTATED RINGERS IV BOLUS
1000.0000 mL | Freq: Once | INTRAVENOUS | Status: AC
  Administered 2023-02-25: 1000 mL via INTRAVENOUS

## 2023-02-25 NOTE — ED Notes (Signed)
Pt gone to CT 

## 2023-02-25 NOTE — ED Triage Notes (Signed)
The pt is a diabetic and she ran out of her usual  diabetic medicine.  So she took an old rx and that was 4 days ago and since then she has numerus complaints  nausea metallic taste in her mouth and she is dizzy and many other complaints  she has not checked her  sugar  lmp 2 months ago  peri menopause

## 2023-02-25 NOTE — Discharge Instructions (Signed)
Joanna Marsh:  Thank you for allowing [place] to take care of you today.  We hope you begin feeling better soon.  To-Do: Please follow-up with your primary doctor. Take the antibiotics for your urinary tract infection as prescribed for the entire course. Use the Reglan as needed for nausea/vomiting.  This medicine can make some people feel anxious. If you feel anxious after taking this medication take 25 mg of Benadryl. Please return to the Emergency Department or call 911 if you experience chest pain, shortness of breath, severe pain, severe fever, altered mental status, or have any reason to think that you need emergency medical care.  Thank you again.  Hope you feel better soon.  Department of Emergency Medicine [facility]

## 2023-02-25 NOTE — ED Provider Notes (Signed)
EMERGENCY DEPARTMENT AT [facility] Provider Note   HPI: Joanna Marsh is a 52 year old female with a past medical history as below including type 2 insulin-dependent diabetes presenting today with nausea, vomiting, abdominal pain.  She reports the symptoms have been ongoing for the past 4 days.  She has had difficulty tolerating p.o. intake with her symptoms.  She denies any history of abdominal surgery.  She denies a history of DKA.  She is here with her husband reports she took an old medication recently for her diabetes.  He believes Januvia is the medication she started because of hyperglycemia and reports this was an old medication.  The patient also endorses some dysuria and foul-smelling urine with the symptoms.  She has not had flank pain.  She does not typically have symptoms like this.  She also endorses a metallic taste in her mouth.  She endorses she tried Zofran but did not help her symptoms.  She does endorse fevers and chills.  Past Medical History:  Diagnosis Date   Diabetes mellitus without complication (HCC)    Hyperlipidemia    Hypertension    Unspecified vitamin D deficiency     Past Surgical History:  Procedure Laterality Date   TUBAL LIGATION Bilateral      Social History   Tobacco Use   Smoking status: Never   Smokeless tobacco: Never  Vaping Use   Vaping Use: Never used  Substance Use Topics   Alcohol use: No   Drug use: Never      Review of Systems  A complete ROS was performed with pertinent positives/negatives noted in the HPI.   Vitals:   02/25/23 1901 02/25/23 1904  BP: 128/76 134/82  Pulse:  71  Resp: 20 20  Temp:  97.8 F (36.6 C)  SpO2:  100%    Physical Exam Vitals and nursing note reviewed.  Constitutional:      General: She is not in acute distress.    Appearance: She is well-developed. She is not ill-appearing.  HENT:     Head: Normocephalic and atraumatic.  Eyes:     Conjunctiva/sclera: Conjunctivae  normal.  Cardiovascular:     Rate and Rhythm: Normal rate and regular rhythm.     Pulses: Normal pulses.     Heart sounds: Normal heart sounds. No murmur heard.    No friction rub. No gallop.  Pulmonary:     Effort: Pulmonary effort is normal. No respiratory distress.     Breath sounds: Normal breath sounds.  Abdominal:     General: Abdomen is flat. There is no distension.     Palpations: Abdomen is soft.     Tenderness: There is generalized abdominal tenderness. There is no guarding or rebound.  Musculoskeletal:        General: No swelling.  Skin:    General: Skin is warm and dry.     Capillary Refill: Capillary refill takes less than 2 seconds.  Neurological:     Mental Status: She is alert.     Procedures  MDM:  Imaging/radiology results:  CT ABDOMEN PELVIS W CONTRAST  Result Date: 02/25/2023 CLINICAL DATA:  abdominal pain EXAM: CT ABDOMEN AND PELVIS WITH CONTRAST TECHNIQUE: Multidetector CT imaging of the abdomen and pelvis was performed using the standard protocol following bolus administration of intravenous contrast. RADIATION DOSE REDUCTION: This exam was performed according to the departmental dose-optimization program which includes automated exposure control, adjustment of the mA and/or kV according to patient size and/or  use of iterative reconstruction technique. CONTRAST:  75mL OMNIPAQUE IOHEXOL 350 MG/ML SOLN COMPARISON:  None Available. FINDINGS: Lower chest: No acute abnormality. Hepatobiliary: No focal liver abnormality. No gallstones, gallbladder wall thickening, or pericholecystic fluid. No biliary dilatation. Pancreas: No focal lesion. Normal pancreatic contour. No surrounding inflammatory changes. No main pancreatic ductal dilatation. Spleen: Normal in size without focal abnormality. Adrenals/Urinary Tract: No adrenal nodule bilaterally. Bilateral kidneys enhance symmetrically. No hydronephrosis. No hydroureter. The urinary bladder is unremarkable. Stomach/Bowel:  Stomach is within normal limits. No evidence of bowel wall thickening or dilatation. Appendix appears normal. Vascular/Lymphatic: No abdominal aorta or iliac aneurysm. Mild atherosclerotic plaque of the aorta and its branches. No abdominal, pelvic, or inguinal lymphadenopathy. Reproductive: Bilateral adnexal regions are unremarkable. Enlarged lobulated uterine contour with underlying hypodense masses some coarsely calcified measuring up to 3.5 cm. Other: No intraperitoneal free fluid. No intraperitoneal free gas. No organized fluid collection. Musculoskeletal: No abdominal wall hernia or abnormality. No suspicious lytic or blastic osseous lesions. No acute displaced fracture. IMPRESSION: 1. No acute intra-abdominal or intrapelvic abnormality. 2. Uterine fibroids. Electronically Signed   By: Tish Frederickson M.D.   On: 02/25/2023 17:28     EKG results: ECG on my interpretation is normal sinus rhythm and rate, without anatomical ischemia representing STEMI, New-onset Arrhythmia, or ischemic equivalent.    Lab results:  Results for orders placed or performed during the hospital encounter of 02/25/23 (from the past 24 hour(s))  Urinalysis, Routine w reflex microscopic -Urine, Clean Catch     Status: Abnormal   Collection Time: 02/25/23  3:13 PM  Result Value Ref Range   Color, Urine YELLOW YELLOW   APPearance HAZY (A) CLEAR   Specific Gravity, Urine 1.027 1.005 - 1.030   pH 5.0 5.0 - 8.0   Glucose, UA >=500 (A) NEGATIVE mg/dL   Hgb urine dipstick NEGATIVE NEGATIVE   Bilirubin Urine NEGATIVE NEGATIVE   Ketones, ur 5 (A) NEGATIVE mg/dL   Protein, ur NEGATIVE NEGATIVE mg/dL   Nitrite POSITIVE (A) NEGATIVE   Leukocytes,Ua NEGATIVE NEGATIVE   RBC / HPF 0-5 0 - 5 RBC/hpf   WBC, UA 0-5 0 - 5 WBC/hpf   Bacteria, UA RARE (A) NONE SEEN   Squamous Epithelial / HPF 0-5 0 - 5 /HPF   Mucus PRESENT   Lipase, blood     Status: None   Collection Time: 02/25/23  3:24 PM  Result Value Ref Range   Lipase 32 11  - 51 U/L  Comprehensive metabolic panel     Status: Abnormal   Collection Time: 02/25/23  3:24 PM  Result Value Ref Range   Sodium 130 (L) 135 - 145 mmol/L   Potassium 4.6 3.5 - 5.1 mmol/L   Chloride 86 (L) 98 - 111 mmol/L   CO2 29 22 - 32 mmol/L   Glucose, Bld 476 (H) 70 - 99 mg/dL   BUN 34 (H) 6 - 20 mg/dL   Creatinine, Ser 1.61 (H) 0.44 - 1.00 mg/dL   Calcium 09.6 8.9 - 04.5 mg/dL   Total Protein 7.9 6.5 - 8.1 g/dL   Albumin 4.1 3.5 - 5.0 g/dL   AST 19 15 - 41 U/L   ALT 28 0 - 44 U/L   Alkaline Phosphatase 77 38 - 126 U/L   Total Bilirubin 1.0 0.3 - 1.2 mg/dL   GFR, Estimated 50 (L) >60 mL/min   Anion gap 15 5 - 15  CBC     Status: Abnormal   Collection Time: 02/25/23  3:24 PM  Result Value Ref Range   WBC 6.5 4.0 - 10.5 K/uL   RBC 5.13 (H) 3.87 - 5.11 MIL/uL   Hemoglobin 13.0 12.0 - 15.0 g/dL   HCT 16.1 09.6 - 04.5 %   MCV 77.6 (L) 80.0 - 100.0 fL   MCH 25.3 (L) 26.0 - 34.0 pg   MCHC 32.7 30.0 - 36.0 g/dL   RDW 40.9 81.1 - 91.4 %   Platelets 294 150 - 400 K/uL   nRBC 0.0 0.0 - 0.2 %  I-Stat beta hCG blood, ED     Status: None   Collection Time: 02/25/23  3:27 PM  Result Value Ref Range   I-stat hCG, quantitative <5.0 <5 mIU/mL   Comment 3          CBG monitoring, ED     Status: Abnormal   Collection Time: 02/25/23  4:41 PM  Result Value Ref Range   Glucose-Capillary 458 (H) 70 - 99 mg/dL  I-Stat venous blood gas, ([facility] ED, [facility], [facility])     Status: Abnormal   Collection Time: 02/25/23  5:52 PM  Result Value Ref Range   pH, Ven 7.384 7.25 - 7.43   pCO2, Ven 55.9 44 - 60 mmHg   pO2, Ven 41 32 - 45 mmHg   Bicarbonate 33.4 (H) 20.0 - 28.0 mmol/L   TCO2 35 (H) 22 - 32 mmol/L   O2 Saturation 74 %   Acid-Base Excess 6.0 (H) 0.0 - 2.0 mmol/L   Sodium 130 (L) 135 - 145 mmol/L   Potassium 4.7 3.5 - 5.1 mmol/L   Calcium, Ion 1.19 1.15 - 1.40 mmol/L   HCT 42.0 36.0 - 46.0 %   Hemoglobin 14.3 12.0 - 15.0 g/dL   Sample type VENOUS   POC CBG, ED     Status: Abnormal    Collection Time: 02/25/23  7:30 PM  Result Value Ref Range   Glucose-Capillary 285 (H) 70 - 99 mg/dL     Key medications administered in the ER:  Medications  famotidine (PEPCID) IVPB 20 mg premix (0 mg Intravenous Stopped 02/25/23 1847)  metoCLOPramide (REGLAN) injection 10 mg (10 mg Intravenous Given 02/25/23 1652)  diphenhydrAMINE (BENADRYL) injection 25 mg (25 mg Intravenous Given 02/25/23 1651)  lactated ringers bolus 1,000 mL (0 mLs Intravenous Stopped 02/25/23 1926)  cefTRIAXone (ROCEPHIN) 1 g in sodium chloride 0.9 % 100 mL IVPB (0 g Intravenous Stopped 02/25/23 1806)  insulin aspart (novoLOG) injection 10 Units (10 Units Subcutaneous Given 02/25/23 1807)  iohexol (OMNIPAQUE) 350 MG/ML injection 75 mL (75 mLs Intravenous Contrast Given 02/25/23 1712)    Medical decision making: -Vital signs stable. Patient afebrile, hemodynamically stable, and non-toxic appearing. -Patient's presentation is most consistent with acute presentation with potential threat to life or bodily function.. Areej Flook is a 52 y.o. female presenting to the emergency department with abdominal pain.  -Additional history obtained from husband at bedside. -Per chart review, patient has not had advanced imaging prior to today of the abdomen or pelvis. -Patient is presenting with abdominal pain.  She has generalized abdominal pain with no focality.  Given history of diabetes we will pursue DKA workup.  Will provide symptomatic care with Reglan, Benadryl, Pepcid, fluid bolus.  Sugars elevated into the 400s therefore given insulin.  Urine shows minimal ketonuria, anion gap is normal at 15 and there is no acidosis on blood gas therefore do not suspect DKA.  CT of the abdomen has been obtained due to the patient's pain and does  not show evidence of acute intra-abdominal abnormality.  CMP is notable for prerenal AKI and patient is status post fluids.  Pregnancy test is negative and she does not have vaginal bleeding or vaginal  discharge changes.  She denies pelvic pain.  Do not suspect ectopic pregnancy, PID, ovarian torsion, or other GU abnormality.  CBC is without anemia or hematologic abnormality.  After symptomatic care, patient with improvement in symptoms and is tolerating p.o. intake.  Her sugar has improved from the 400 and's into the 200s.  Will continue to treat likely UTI with Keflex.  Patient reports Reglan significantly helped her symptoms therefore will prescribe to her pharmacy.  Given stable vitals, clinical improvement, and reassuring workup I believe it is safe for discharge home.  I discussed strict return precautions for any return.  Discussed follow-up plan.  Patient discharged.   Medical Decision Making Amount and/or Complexity of Data Reviewed Labs:  Decision-making details documented in ED Course. Radiology: ordered.  Risk Prescription drug management.     The plan for this patient was discussed with Dr. Wallace Cullens, who voiced agreement and who oversaw evaluation and treatment of this patient.  Marta Lamas, MD Emergency Medicine, PGY-3  Note: Dragon medical dictation software was used in the creation of this note.   Clinical Impression:  1. Generalized abdominal pain   2. Dehydration   3. Hyperglycemia     Rx / DC Orders ED Discharge Orders          Ordered    cephALEXin (KEFLEX) 500 MG capsule  2 times daily        02/25/23 1953    metoCLOPramide (REGLAN) 10 MG tablet  Every 6 hours PRN        02/25/23 1953               Chase Caller, MD 02/25/23 2006    Sloan Leiter, DO 02/26/23 1930    Sloan Leiter, DO 03/05/23 [phone]

## 2023-04-02 ENCOUNTER — Other Ambulatory Visit: Payer: Self-pay | Admitting: Internal Medicine

## 2023-04-02 DIAGNOSIS — Z1231 Encounter for screening mammogram for malignant neoplasm of breast: Secondary | ICD-10-CM

## 2023-07-05 ENCOUNTER — Ambulatory Visit: Payer: Self-pay

## 2023-10-22 ENCOUNTER — Encounter: Payer: No Typology Code available for payment source | Admitting: Obstetrics and Gynecology

## 2024-06-10 ENCOUNTER — Other Ambulatory Visit: Payer: Self-pay

## 2024-06-10 ENCOUNTER — Emergency Department (HOSPITAL_COMMUNITY)

## 2024-06-10 ENCOUNTER — Emergency Department (HOSPITAL_COMMUNITY)
Admission: EM | Admit: 2024-06-10 | Discharge: 2024-06-10 | Disposition: A | Discharge status: Home / Self Care | Attending: Emergency Medicine | Admitting: Emergency Medicine

## 2024-06-10 ENCOUNTER — Encounter (HOSPITAL_COMMUNITY): Payer: Self-pay

## 2024-06-10 DIAGNOSIS — T383X5A Adverse effect of insulin and oral hypoglycemic [antidiabetic] drugs, initial encounter: Secondary | ICD-10-CM | POA: Insufficient documentation

## 2024-06-10 DIAGNOSIS — R112 Nausea with vomiting, unspecified: Secondary | ICD-10-CM | POA: Insufficient documentation

## 2024-06-10 DIAGNOSIS — Z7982 Long term (current) use of aspirin: Secondary | ICD-10-CM | POA: Diagnosis not present

## 2024-06-10 DIAGNOSIS — E1165 Type 2 diabetes mellitus with hyperglycemia: Secondary | ICD-10-CM | POA: Diagnosis not present

## 2024-06-10 DIAGNOSIS — Z7984 Long term (current) use of oral hypoglycemic drugs: Secondary | ICD-10-CM | POA: Diagnosis not present

## 2024-06-10 DIAGNOSIS — T887XXA Unspecified adverse effect of drug or medicament, initial encounter: Secondary | ICD-10-CM

## 2024-06-10 DIAGNOSIS — R0789 Other chest pain: Secondary | ICD-10-CM | POA: Insufficient documentation

## 2024-06-10 LAB — CBC WITH DIFFERENTIAL/PLATELET
Abs Immature Granulocytes: 0.01 K/uL (ref 0.00–0.07)
Basophils Absolute: 0 K/uL (ref 0.0–0.1)
Basophils Relative: 0 %
Eosinophils Absolute: 0.1 K/uL (ref 0.0–0.5)
Eosinophils Relative: 2 %
HCT: 35.3 % — ABNORMAL LOW (ref 36.0–46.0)
Hemoglobin: 11.6 g/dL — ABNORMAL LOW (ref 12.0–15.0)
Immature Granulocytes: 0 %
Lymphocytes Relative: 63 %
Lymphs Abs: 2.9 K/uL (ref 0.7–4.0)
MCH: 25.9 pg — ABNORMAL LOW (ref 26.0–34.0)
MCHC: 32.9 g/dL (ref 30.0–36.0)
MCV: 78.8 fL — ABNORMAL LOW (ref 80.0–100.0)
Monocytes Absolute: 0.3 K/uL (ref 0.1–1.0)
Monocytes Relative: 7 %
Neutro Abs: 1.3 K/uL — ABNORMAL LOW (ref 1.7–7.7)
Neutrophils Relative %: 28 %
Platelets: 288 K/uL (ref 150–400)
RBC: 4.48 MIL/uL (ref 3.87–5.11)
RDW: 12 % (ref 11.5–15.5)
WBC: 4.6 K/uL (ref 4.0–10.5)
nRBC: 0 % (ref 0.0–0.2)

## 2024-06-10 LAB — COMPREHENSIVE METABOLIC PANEL WITH GFR
ALT: 15 U/L (ref 0–44)
AST: 17 U/L (ref 15–41)
Albumin: 3.9 g/dL (ref 3.5–5.0)
Alkaline Phosphatase: 41 U/L (ref 38–126)
Anion gap: 10 (ref 5–15)
BUN: 23 mg/dL — ABNORMAL HIGH (ref 6–20)
CO2: 25 mmol/L (ref 22–32)
Calcium: 9.4 mg/dL (ref 8.9–10.3)
Chloride: 100 mmol/L (ref 98–111)
Creatinine, Ser: 0.93 mg/dL (ref 0.44–1.00)
GFR, Estimated: >60 mL/min (ref >60)
Glucose, Bld: 263 mg/dL — ABNORMAL HIGH (ref 70–99)
Potassium: 4.4 mmol/L (ref 3.5–5.1)
Sodium: 135 mmol/L (ref 135–145)
Total Bilirubin: 0.7 mg/dL (ref 0.0–1.2)
Total Protein: 6.9 g/dL (ref 6.5–8.1)

## 2024-06-10 LAB — TROPONIN I (HIGH SENSITIVITY)
Troponin I (High Sensitivity): 2 ng/L (ref <18)
Troponin I (High Sensitivity): <2 ng/L (ref <18)

## 2024-06-10 LAB — LIPASE, BLOOD: Lipase: 54 U/L — ABNORMAL HIGH (ref 11–51)

## 2024-06-10 MED ORDER — ONDANSETRON 4 MG PO TBDP
4.0000 mg | ORAL_TABLET | Freq: Once | ORAL | Status: AC
  Administered 2024-06-10: 4 mg via ORAL
  Filled 2024-06-10: qty 1

## 2024-06-10 MED ORDER — ONDANSETRON 4 MG PO TBDP: 4.0000 mg | ORAL_TABLET | Freq: Three times a day (TID) | ORAL | 0 refills | Status: AC | PRN

## 2024-06-10 MED ORDER — FAMOTIDINE 20 MG PO TABS: 20.0000 mg | ORAL_TABLET | Freq: Two times a day (BID) | ORAL | 0 refills | Status: AC

## 2024-06-10 NOTE — ED Triage Notes (Addendum)
 Pt c/o nausea and chest pain on and off for about 2 weeks. Pt also has SOB, dizziness. Denies any cardiac hx, has hypertension and diabetes. Pt states she cannot keep food down, pt is on Mounjaro and increased dose in last few weeks.

## 2024-06-10 NOTE — ED Notes (Signed)
 Tech gave pt. [place] sandwich bag and ginger ale.Pt. tolerated food w/ no complaints. Currently resting in bed

## 2024-06-10 NOTE — ED Provider Notes (Signed)
 Joanna Marsh EMERGENCY DEPARTMENT AT [facility] Provider Note   CSN: [id number] Arrival date & time: 06/10/24  9070     Patient presents with: No chief complaint on file.   Joanna Marsh is a 53 y.o. female.   53 year old female with a history of diabetes on Mounjaro who presents emergency department with nausea and vomiting and chest discomfort.  Patient reports that for the past 2 weeks she has been having lots of nausea and vomiting.  Typically will have 1-2 episodes of vomiting a day.  Has lost lots of weight after being started on the Mounjaro but is worried that it is likely causing her symptoms.  Occasionally gets some chest discomfort as well from it.  It is not exertional or pleuritic.  No diaphoresis.  No significant abdominal pain.       Prior to Admission medications   Medication Sig Start Date End Date Taking? Authorizing Provider  famotidine  (PEPCID ) 20 MG tablet Take 1 tablet (20 mg total) by mouth 2 (two) times daily. 06/10/24  Yes Yolande Lamar BROCKS, MD  ondansetron (ZOFRAN-ODT) 4 MG disintegrating tablet Take 1 tablet (4 mg total) by mouth every 8 (eight) hours as needed for nausea or vomiting. 06/10/24  Yes Yolande Lamar BROCKS, MD  Alogliptin  Benzoate (NESINA ) 25 MG TABS Once daily Patient not taking: Reported on 12/10/2019 06/25/15   Craig Alan SAUNDERS, PA-C  aspirin 81 MG tablet Take 81 mg by mouth daily.    [provider]  atorvastatin  (LIPITOR) 40 MG tablet TAKE ONE TABLET BY MOUTH ONCE DAILY FOR CHOLESTEROL Patient taking differently: Takes 10 mg 05/25/15   Collier, Amanda R, PA-C  fluconazole  (DIFLUCAN ) 150 MG tablet Take 1 tablet (150 mg total) by mouth once. Patient not taking: Reported on 12/10/2019 06/25/15   Craig Alan SAUNDERS, PA-C  fluconazole  (DIFLUCAN ) 150 MG tablet Take 150 mg by mouth daily. Has available if needed    [provider]  glipiZIDE  (GLUCOTROL ) 5 MG tablet Take 1 tablet (5 mg total) by mouth 2 (two) times daily. 06/25/15  06/24/16  Craig Alan R, PA-C  glucose monitoring kit (FREESTYLE) monitoring kit 1 each by Does not apply route as needed for other. Please fill strips 1 qd 07/11/13   Claudene Setter, PA-C  liraglutide (VICTOZA) 18 MG/3ML SOPN 1.8 mg daily 09/19/19   [provider]  lisinopril  (PRINIVIL ,ZESTRIL ) 10 MG tablet TAKE ONE TABLET BY MOUTH ONCE DAILY FOR BLOOD PRESSURE AND  KIDNEYS 05/25/15   Collier, Amanda R, PA-C  metFORMIN  (GLUCOPHAGE -XR) 500 MG 24 hr tablet TAKE TWO TABLETS BY MOUTH TWICE DAILY FOR DIABETES 06/25/15 09/25/15  Tonita Fallow, MD  metoCLOPramide  (REGLAN ) 10 MG tablet Take 1 tablet (10 mg total) by mouth every 6 (six) hours as needed for nausea or vomiting. 02/25/23   Sandie Signe BROCKS, MD  mometasone  (ELOCON ) 0.1 % cream APPLY  CREAM TO AFFECTED AREA ONCE DAILY 05/25/15   Collier, Amanda R, PA-C  sitaGLIPtin  (JANUVIA ) 100 MG tablet Take by mouth daily. 03/14/19   [provider]    Allergies: Patient has no known allergies.    Review of Systems  Updated Vital Signs BP 120/83   Pulse 78   Temp 97.7 F (36.5 C)   Resp 14   Ht 5' 5 (1.651 m)   Wt 59 kg   SpO2 100%   BMI 21.63 kg/m   Physical Exam Vitals and nursing note reviewed.  Constitutional:      General: She is not in acute  distress.    Appearance: She is well-developed.  HENT:     Head: Normocephalic and atraumatic.     Right Ear: External ear normal.     Left Ear: External ear normal.     Nose: Nose normal.  Eyes:     Extraocular Movements: Extraocular movements intact.     Conjunctiva/sclera: Conjunctivae normal.     Pupils: Pupils are equal, round, and reactive to light.  Cardiovascular:     Rate and Rhythm: Normal rate and regular rhythm.     Heart sounds: No murmur heard. Pulmonary:     Effort: Pulmonary effort is normal. No respiratory distress.     Breath sounds: Normal breath sounds.  Abdominal:     General: Abdomen is flat. There is no distension.     Palpations: Abdomen  is soft. There is no mass.     Tenderness: There is no abdominal tenderness. There is no guarding.  Musculoskeletal:     Cervical back: Normal range of motion and neck supple.  Skin:    General: Skin is warm and dry.  Neurological:     Mental Status: She is alert and oriented to person, place, and time. Mental status is at baseline.  Psychiatric:        Mood and Affect: Mood normal.     (all labs ordered are listed, but only abnormal results are displayed) Labs Reviewed  COMPREHENSIVE METABOLIC PANEL WITH GFR - Abnormal; Notable for the following components:      Result Value   Glucose, Bld 263 (*)    BUN 23 (*)    All other components within normal limits  CBC WITH DIFFERENTIAL/PLATELET - Abnormal; Notable for the following components:   Hemoglobin 11.6 (*)    HCT 35.3 (*)    MCV 78.8 (*)    MCH 25.9 (*)    Neutro Abs 1.3 (*)    All other components within normal limits  LIPASE, BLOOD - Abnormal; Notable for the following components:   Lipase 54 (*)    All other components within normal limits  TROPONIN I (HIGH SENSITIVITY)  TROPONIN I (HIGH SENSITIVITY)    EKG: EKG Interpretation Date/Time:  Tuesday June 10 2024 09:36:30 EDT Ventricular Rate:  69 PR Interval:  158 QRS Duration:  90 QT Interval:  398 QTC Calculation: 426 R Axis:   76  Text Interpretation: Normal sinus rhythm Normal ECG When compared with ECG of 25-Feb-2023 16:16, No significant change since last tracing Confirmed by Towana Sharper [phone]) on 06/10/2024 9:41:29 AM  Radiology: DG Chest 2 View Result Date: 06/10/2024 EXAM: 2 VIEW(S) XRAY OF THE CHEST 06/10/2024 10:25:00 AM COMPARISON: None available. CLINICAL HISTORY: cp. Pt having chest tightness, nausea, dizziness for 2 weeks - hx of htn, diabetes FINDINGS: LUNGS AND PLEURA: No focal pulmonary opacity. No pulmonary edema. No pleural effusion. No pneumothorax. HEART AND MEDIASTINUM: No acute abnormality of the cardiac and mediastinal silhouettes.  BONES AND SOFT TISSUES: No acute osseous abnormality. IMPRESSION: 1. No acute abnormalities. Electronically signed by: Katheleen Faes MD 06/10/2024 10:57 AM EDT RP Workstation: [id number]     Procedures   Medications Ordered in the ED  ondansetron (ZOFRAN-ODT) disintegrating tablet 4 mg (has no administration in time range)                                    Medical Decision Making Risk Prescription drug management.   Joanna Marsh is a  53 year old female with a history of diabetes on Mounjaro who presents emergency department with nausea and vomiting and chest discomfort.   Initial Ddx:  SBO, ileus, gastroenteritis, Wegovy side effect, intra-abdominal abscess/infection, MI, PE, gastritis, esophagitis  MDM/Course:  Patient presents emergency department with nausea and vomiting.  This in the setting of having her Wegovy dose increased several weeks ago.  Not complaining of any significant abdominal pain.  No fevers.  No diarrhea.  On exam no significant abdominal tenderness to palpation she is overall well-appearing.  Also complaining of some chest discomfort.  Not exertional or pleuritic.  No major risk factors for DVT or PE.  She is satting well on room air and is not tachycardic.  Suspect that this is likely due to esophagitis from her vomiting.  EKG and serial troponins not reflective of MI.  Lipase only marginally elevated at 54 which I suspect is from her vomiting but not due to pancreatitis.  Chest x-ray without acute findings.  Given Zofran for nausea and vomiting and upon re-evaluation was able to tolerate p.o.  Instructed to follow-up with her primary doctor about adjusting the [facility] and given Zofran and Pepcid  prescription to take at home  This patient presents to the ED for concern of complaints listed in HPI, this involves an extensive number of treatment options, and is a complaint that carries with it a high risk of complications and morbidity. Disposition including potential  need for admission considered.   Dispo: DC Home. Return precautions discussed including, but not limited to, those listed in the AVS. Allowed pt time to ask questions which were answered fully prior to dc.  Additional history obtained from spouse Records reviewed Outpatient Clinic Notes The following labs were independently interpreted: Chemistry and show Hyperglycemia I independently reviewed the following imaging with scope of interpretation limited to determining acute life threatening conditions related to emergency care: Chest x-ray and agree with the radiologist interpretation with the following exceptions: none I personally reviewed and interpreted the pt's EKG: see above for interpretation  I have reviewed the patients home medications and made adjustments as needed  Portions of this note were generated with Dragon dictation software. Dictation errors may occur despite best attempts at proofreading.      Final diagnoses:  Nausea and vomiting, unspecified vomiting type  Medication side effect  Chest discomfort    ED Discharge Orders          Ordered    ondansetron (ZOFRAN-ODT) 4 MG disintegrating tablet  Every 8 hours PRN        06/10/24 1655    famotidine  (PEPCID ) 20 MG tablet  2 times daily        06/10/24 1656               Yolande Lamar BROCKS, MD 06/10/24 1701

## 2024-06-10 NOTE — Discharge Instructions (Addendum)
 You were seen for your nausea and vomiting and chest discomfort in the emergency department.  The nausea and vomiting are likely due to the [facility].  Please talk to your primary doctor about adjusting his medication.  The chest discomfort is likely from esophageal irritation from the vomiting.   At home, please take the Pepcid  for your chest discomfort.  Take the Zofran for your nausea and vomiting.    Check your MyChart online for the results of any tests that had not resulted by the time you left the emergency department.   Follow-up with your primary doctor in 2-3 days regarding your visit.  Talk to them about alternative medication instead of the [facility].  Return immediately to the emergency department if you experience any of the following: Worsening pain, vomiting despite the medication, or any other concerning symptoms.    Thank you for visiting our Emergency Department. It was a pleasure taking care of you today.

## 2024-06-10 NOTE — ED Provider Triage Note (Signed)
 Emergency Medicine Provider Triage Evaluation Note  Joanna Marsh , a 53 y.o. female  was evaluated in triage.  Pt complains of 2 weeks of pain in her chest shortness of breath dizziness nausea hot flashes.  Review of Systems  Positive: Chest pain Negative: Abdominal pain  Physical Exam  BP 130/81 (BP Location: Right Arm)   Pulse 74   Temp 97.6 F (36.4 C) (Oral)   Resp 16   Ht 5' 5 (1.651 m)   Wt 59 kg   SpO2 100%   BMI 21.63 kg/m  Gen:   Awake, no distress   Resp:  Normal effort  MSK:   Moves extremities without difficulty  Other:    Medical Decision Making  Medically screening exam initiated at 9:44 AM.  Appropriate orders placed.  Joanna Marsh was informed that the remainder of the evaluation will be completed by another provider, this initial triage assessment does not replace that evaluation, and the importance of remaining in the ED until their evaluation is complete.     Joanna Ozell BROCKS, MD 06/10/24 [phone]
